# Patient Record
Sex: Female | Born: 1980 | Race: Black or African American | Hispanic: No | Marital: Single | Smoking: Never smoker
Health system: Southern US, Community
[De-identification: ages and names within clinical notes are randomized; demographics above are authoritative.]

## PROBLEM LIST (undated history)

## (undated) ENCOUNTER — Emergency Department: Admission: EM | Payer: Self-pay

## (undated) DIAGNOSIS — M67432 Ganglion, left wrist: Secondary | ICD-10-CM

## (undated) DIAGNOSIS — I10 Essential (primary) hypertension: Secondary | ICD-10-CM

## (undated) DIAGNOSIS — R7303 Prediabetes: Secondary | ICD-10-CM

## (undated) DIAGNOSIS — D219 Benign neoplasm of connective and other soft tissue, unspecified: Secondary | ICD-10-CM

## (undated) DIAGNOSIS — N979 Female infertility, unspecified: Secondary | ICD-10-CM

## (undated) DIAGNOSIS — D509 Iron deficiency anemia, unspecified: Secondary | ICD-10-CM

## (undated) DIAGNOSIS — Z8742 Personal history of other diseases of the female genital tract: Secondary | ICD-10-CM

## (undated) DIAGNOSIS — K649 Unspecified hemorrhoids: Secondary | ICD-10-CM

## (undated) DIAGNOSIS — J45909 Unspecified asthma, uncomplicated: Secondary | ICD-10-CM

## (undated) DIAGNOSIS — R590 Localized enlarged lymph nodes: Secondary | ICD-10-CM

## (undated) DIAGNOSIS — M549 Dorsalgia, unspecified: Secondary | ICD-10-CM

## (undated) HISTORY — PX: NO SURGICAL HISTORY: 101002

## (undated) HISTORY — DX: Essential (primary) hypertension: I10

## (undated) NOTE — ED Triage Notes (Signed)
Formatting of this note might be different from the original.  Pt from home c/o SOB that started last night. At approx midnight Pt was awoken from sleep with tightness in her chest and SOB. Pt stated that she was gasping for air and shallow breaths. Pt stated that the episode lasted until about 0600 this morning. Pt stated SOB has resolved, tightness remains but is listed as  a 0 out of 10 at this time.    Electronically signed by Adolm Joseph, RN at 08/04/2022  9:59 AM EST

## (undated) NOTE — ED Provider Notes (Signed)
Formatting of this note is different from the original.  Images from the original note were not included.    [facility]  Provider Note    CSN: [id number]  Arrival date & time: 08/04/22  1478        History    Chief Complaint   Patient presents with    Shortness of Breath     Kayla Morris is a 24 y.o. female.    Shortness of Breath  Patient is a 20 year old female with a past medical history significant for hypertension on BP medications, infertility, iron deficiency anemia    She presented emergency room today with chief complaint of some shortness of breath overnight.  She states she went to bed at approximately 10 PM and awoke at midnight because she states she felt somewhat short of breath.  She got up and walked around drink some water and felt better.  She fell asleep again but woke up 2 more times before 6 AM feeling somewhat short of breath each time.    She states she has had no chest pain no lightheadedness dizziness nausea or vomiting.  She states that her symptoms completely resolved by the time she was up and walking around at 6 AM this morning.  She denies any lower extremity swelling or calf pain.  She denies any cough or hemoptysis.  She is not on any oral contraceptive medications.  No recent travel or hospitalizations or Surgeries    No other significant associated symptoms.    She states that she is on a long-term monitor that was removed recently by [place] and she is awaiting results of this.      Home Medications  Prior to Admission medications    Medication Sig Start Date End Date Taking? Authorizing Provider   ferrous sulfate 220 (44 Fe) MG/5ML solution Take 10 mLs by mouth daily. 03/03/20   [provider]   NIFEdipine (PROCARDIA-XL/NIFEDICAL-XL) 30 MG 24 hr tablet 1 tablet by mount daily  Patient taking differently: 30 mg daily. 02/26/21   Chancy Milroy, MD   olmesartan-hydrochlorothiazide (BENICAR HCT) 40-12.5 MG tablet Take 1 tablet by mouth daily.  07/27/22   [provider]       Allergies     Patient has no known allergies.      Review of Systems    Review of Systems   Respiratory:  Positive for shortness of breath.      Physical Exam  Updated Vital Signs  BP (!) 159/91   Pulse (!) 52   Temp 98.3 F (36.8 C)   Resp 19   Ht '5\' 8"'$  (1.727 m)   Wt 77.1 kg   LMP 07/25/2022 (Approximate)   SpO2 100%   BMI 25.85 kg/m   Physical Exam  Vitals and nursing note reviewed.   Constitutional:       General: She is not in acute distress.     Comments: Pleasant well-appearing 31 year old.  In no acute distress.  Sitting comfortably in bed.  Able answer questions appropriately follow commands. No increased work of breathing. Speaking in full sentences.    HENT:      Head: Normocephalic and atraumatic.      Nose: Nose normal.      Mouth/Throat:      Mouth: Mucous membranes are moist.   Eyes:      General: No scleral icterus.  Cardiovascular:      Rate and Rhythm: Normal rate and regular rhythm.  Pulses: Normal pulses.      Heart sounds: Normal heart sounds.   Pulmonary:      Effort: Pulmonary effort is normal. No respiratory distress.      Breath sounds: Normal breath sounds. No wheezing.   Abdominal:      Palpations: Abdomen is soft.      Tenderness: There is no abdominal tenderness.   Musculoskeletal:      Cervical back: Normal range of motion.      Right lower leg: No edema.      Left lower leg: No edema.   Skin:     General: Skin is warm and dry.      Capillary Refill: Capillary refill takes less than 2 seconds.   Neurological:      Mental Status: She is alert. Mental status is at baseline.   Psychiatric:         Mood and Affect: Mood normal.         Behavior: Behavior normal.     ED Results / Procedures / Treatments    Labs  (all labs ordered are listed, but only abnormal results are displayed)  Labs Reviewed   BASIC METABOLIC PANEL   CBC   PREGNANCY, URINE   TROPONIN I (HIGH SENSITIVITY)     EKG  None    Radiology  DG Chest 2 View    Result Date:  08/04/2022  CLINICAL DATA:  Chest tightness.  Shortness of breath. EXAM: CHEST - 2 VIEW COMPARISON:  03/07/2022 FINDINGS: The heart size and mediastinal contours are within normal limits. Both lungs are clear. The visualized skeletal structures are unremarkable. IMPRESSION: No active cardiopulmonary disease. Electronically Signed   By: Kerby Moors M.D.   On: 08/04/2022 10:46      Procedures  Procedures     Medications Ordered in ED  Medications   alum & mag hydroxide-simeth (MAALOX/MYLANTA) 200-200-20 MG/5ML suspension 30 mL (30 mLs Oral Given 08/04/22 1310)   famotidine (PEPCID) tablet 20 mg (20 mg Oral Given 08/04/22 1309)     ED Course/ Medical Decision Making/ A&P      Medical Decision Making  Amount and/or Complexity of Data Reviewed  Labs: ordered.  Radiology: ordered.    Risk  OTC drugs.    This patient presents to the ED for concern of dyspnea overnight, this involves a number of treatment options, and is a complaint that carries with it a moderate to high risk of complications and morbidity. A differential diagnosis was considered for the patient's symptoms which is discussed below:     The causes for shortness of breath include but are not limited to  Cardiac (AHF, pericardial effusion and tamponade, arrhythmias, ischemia, etc)  Respiratory (COPD, asthma, pneumonia, pneumothorax, primary pulmonary hypertension, PE/VQ mismatch)  Hematological (anemia)  Neuromuscular (ALS, Guillain-Barr, etc)     Co morbidities:  Discussed in HPI    Brief History:    Patient is a 65 year old female with a past medical history significant for hypertension on BP medications, infertility, iron deficiency anemia    She presented emergency room today with chief complaint of some shortness of breath overnight.  She states she went to bed at approximately 10 PM and awoke at midnight because she states she felt somewhat short of breath.  She got up and walked around drink some water and felt better.  She fell asleep again but woke  up 2 more times before 6 AM feeling somewhat short of breath each time.    She states  she has had no chest pain no lightheadedness dizziness nausea or vomiting.  She states that her symptoms completely resolved by the time she was up and walking around at 6 AM this morning.  She denies any lower extremity swelling or calf pain.  She denies any cough or hemoptysis.  She is not on any oral contraceptive medications.  No recent travel or hospitalizations or Surgeries    No other significant associated symptoms.    She states that she is on a long-term monitor that was removed recently by [place] and she is awaiting results of this.    EMR reviewed including pt PMHx, past surgical history and past visits to ER.     See HPI for more details    Lab Tests:    I personally reviewed all laboratory work and imaging. Metabolic panel without any acute abnormality specifically kidney function within normal limits and no significant electrolyte abnormalities. CBC without leukocytosis or significant anemia.  Troponin which was obtained at approximately 9 AM approximately 3 hours after his symptoms resolved and 9 hours after her symptoms began was within normal limits.    Imaging Studies:    NAD. I personally reviewed all imaging studies and no acute abnormality found. I agree with radiology interpretation.    Cardiac Monitoring:    The patient was maintained on a cardiac monitor.  I personally viewed and interpreted the cardiac monitored which showed an underlying rhythm of: NSR  EKG non-ischemic    Medicines ordered:    I ordered medication including GI cocktail and Pepcid for possible reflux related symptoms  Reevaluation of the patient after these medicines showed that the patient stayed the same  I have reviewed the patients home medicines and have made adjustments as needed    Critical Interventions:    Consults/Attending Physician    Reevaluation:    After the interventions noted above I re-evaluated patient and found that they  have :stayed the same    Social Determinants of Health:    Problem List / ED Course:    Dyspnea overnight.  She woke up 3 times in the middle of the night feeling somewhat short of breath but was able to fall asleep afterwards each time.  Is symptom-free now.  Troponin was drawn 3 hours after her symptoms resolved is normal.  She had persistent symptoms for approximately 6 hours between midnight and 6 AM.  Work-up here reassuring.  She has been seen for some tachycardic episodes in the past and had a negative PET scan done 5 months ago.  I have very low suspicion for pulmonary embolism and patient is in fact PERC negative.  She is symptom-free currently.  No evidence of pneumothorax on chest x-ray, doubt dissection with complete resolution of her symptoms and no chest pain or paresthesias numbness or weakness, and symmetric pulses.  Low suspicion for pericarditis or myocarditis the latter of which would have elevated her troponins and the former is not supported by her history, physical exam, EKG.  Return precautions discussed.  She will follow-up with her care team at the [place].  She will return to the emergency room for any return of symptoms or any new or concerning symptoms.  As she is completely symptom-free at this time.  Will discharge home with return precautions.    Dispostion:    After consideration of the diagnostic results and the patients response to treatment, I feel that the patent would benefit from close outpatient follow-up.    Final Clinical  Impression(s) / ED Diagnoses  Final diagnoses:   Dyspnea, unspecified type     Rx / DC Orders  ED Discharge Orders       None           Tedd Sias, [place]  08/04/22 1328      Regan Lemming, MD  08/04/22 1723    Electronically signed by Regan Lemming, MD at 08/04/2022  5:23 PM EST    Associated attestation - Regan Lemming, MD - 08/04/2022  5:23 PM EST  Formatting of this note might be different from the original.  Attestation: Medical screening  examination/treatment/procedure(s) were performed by non-physician practitioner and as supervising physician I was immediately available for consultation/collaboration.

## (undated) NOTE — ED Notes (Signed)
Formatting of this note might be different from the original.  Discharge instructions and follow up care reviewed and explained, pt verbalized understanding and had no further questions on d/c. Pt caox4 and ambulatory on d/c.   Electronically signed by Gaspar Skeeters, RN at 08/04/2022  2:03 PM EST

## (undated) NOTE — ED Notes (Signed)
Formatting of this note might be different from the original.  Pt given urine cup to provide a sample.  Electronically signed by Belinda Fisher, EMT at 08/04/2022 10:47 AM EST

---

## 1998-06-11 ENCOUNTER — Other Ambulatory Visit: Admission: RE | Admit: 1998-06-11 | Discharge: 1998-06-11 | Payer: Self-pay | Admitting: Obstetrics and Gynecology

## 1999-07-07 ENCOUNTER — Other Ambulatory Visit: Admission: RE | Admit: 1999-07-07 | Discharge: 1999-07-07 | Payer: Self-pay | Admitting: Obstetrics and Gynecology

## 2002-02-06 ENCOUNTER — Other Ambulatory Visit: Admission: RE | Admit: 2002-02-06 | Discharge: 2002-02-06 | Payer: Self-pay | Admitting: Obstetrics and Gynecology

## 2004-10-16 ENCOUNTER — Other Ambulatory Visit: Admission: RE | Admit: 2004-10-16 | Discharge: 2004-10-16 | Payer: Self-pay | Admitting: Obstetrics and Gynecology

## 2005-03-27 ENCOUNTER — Emergency Department (HOSPITAL_COMMUNITY): Admission: EM | Admit: 2005-03-27 | Discharge: 2005-03-27 | Payer: Self-pay | Admitting: Emergency Medicine

## 2005-04-15 ENCOUNTER — Other Ambulatory Visit: Admission: RE | Admit: 2005-04-15 | Discharge: 2005-04-15 | Payer: Self-pay | Admitting: Obstetrics and Gynecology

## 2005-06-11 ENCOUNTER — Emergency Department (HOSPITAL_COMMUNITY): Admission: EM | Admit: 2005-06-11 | Discharge: 2005-06-11 | Payer: Self-pay | Admitting: Family Medicine

## 2005-07-01 ENCOUNTER — Other Ambulatory Visit: Admission: RE | Admit: 2005-07-01 | Discharge: 2005-07-01 | Payer: Self-pay | Admitting: Obstetrics and Gynecology

## 2005-10-15 ENCOUNTER — Other Ambulatory Visit: Admission: RE | Admit: 2005-10-15 | Discharge: 2005-10-15 | Payer: Self-pay | Admitting: Obstetrics and Gynecology

## 2006-06-17 ENCOUNTER — Emergency Department (HOSPITAL_COMMUNITY): Admission: EM | Admit: 2006-06-17 | Discharge: 2006-06-17 | Payer: Self-pay | Admitting: Family Medicine

## 2006-10-27 ENCOUNTER — Emergency Department (HOSPITAL_COMMUNITY): Admission: EM | Admit: 2006-10-27 | Discharge: 2006-10-27 | Payer: Self-pay | Admitting: Family Medicine

## 2007-09-28 ENCOUNTER — Emergency Department (HOSPITAL_COMMUNITY): Admission: EM | Admit: 2007-09-28 | Discharge: 2007-09-28 | Payer: Self-pay | Admitting: Family Medicine

## 2017-11-23 ENCOUNTER — Encounter: Payer: Self-pay | Admitting: Family Medicine

## 2017-11-23 ENCOUNTER — Telehealth: Payer: Self-pay

## 2017-11-23 NOTE — Progress Notes (Signed)
PVP chart review for new patient. Attempted to get CareEverywhere Records - none found. Patient's appointment is on 02/27

## 2017-11-24 ENCOUNTER — Ambulatory Visit: Payer: 59 | Admitting: Family Medicine

## 2017-11-24 ENCOUNTER — Encounter: Payer: Self-pay | Admitting: Family Medicine

## 2017-11-24 VITALS — BP 124/90 | HR 67 | Temp 97.3°F | Ht 68.0 in | Wt 187.4 lb

## 2017-11-24 DIAGNOSIS — N760 Acute vaginitis: Principal | ICD-10-CM

## 2017-11-24 DIAGNOSIS — I10 Essential (primary) hypertension: Secondary | ICD-10-CM

## 2017-11-24 MED ORDER — FLUCONAZOLE 150 MG TABLET
150.0000 mg | ORAL_TABLET | Freq: Once | ORAL | 0 refills | Status: AC
Start: 2017-11-24 — End: 2017-11-25

## 2017-11-24 NOTE — Patient Instructions (Addendum)
Start checking blood pressure at home once daily, write the numbers down and e-mail (MyChart), mail or drop off the list of readings to me after 7-10 days of monitoring your BP at home.        High Blood Pressure: Care Instructions  Your Care Instructions  If your blood pressure is usually above 140/90, you have high blood pressure, or hypertension. Despite what a lot of people think, high blood pressure usually doesn't cause headaches or make you feel dizzy or lightheaded. It usually has no symptoms. But it does increase your risk for heart attack, stroke, and kidney or eye damage. The higher your blood pressure, the more your risk increases.  Your doctor will give you a goal for your blood pressure. Your goal will be based on your health and your age. An example of a goal is to keep your blood pressure below 140/90.  Lifestyle changes, such as eating healthy and being active, are always important to help lower blood pressure. You might also take medicine to reach your blood pressure goal.  Follow-up care is a key part of your treatment and safety. Be sure to make and go to all appointments, and call your doctor if you are having problems. It's also a good idea to know your test results and keep a list of the medicines you take.  How can you care for yourself at home?  Medical treatment   If you stop taking your medicine, your blood pressure will go back up. You may take one or more types of medicine to lower your blood pressure. Be safe with medicines. Take your medicine exactly as prescribed. Call your doctor if you think you are having a problem with your medicine.   Your doctor may suggest that you take one low-dose aspirin (81 mg) a day. This can help reduce your risk of having a stroke or heart attack.   See your doctor regularly. You may need to see the doctor more often at first or until your blood pressure comes down.   If you are taking blood pressure medicine, talk to your doctor before you take  decongestants or anti-inflammatory medicine, such as ibuprofen. Some of these medicines can raise blood pressure.   Learn how to check your blood pressure at home.  Lifestyle changes   Stay at a healthy weight. This is especially important if you put on weight around the waist. Losing even 10 pounds can help you lower your blood pressure.   If your doctor recommends it, get more exercise. Walking is a good choice. Bit by bit, increase the amount you walk every day. Try for at least 30 minutes on most days of the week. You also may want to swim, bike, or do other activities.   Avoid or limit alcohol. Talk to your doctor about whether you can drink any alcohol.   Try to limit how much sodium you eat to less than 2,300 milligrams (mg) a day. Your doctor may ask you to try to eat less than 1,500 mg a day.   Eat plenty of fruits (such as bananas and oranges), vegetables, legumes, whole grains, and low-fat dairy products.   Lower the amount of saturated fat in your diet. Saturated fat is found in animal products such as milk, cheese, and meat. Limiting these foods may help you lose weight and also lower your risk for heart disease.   Do not smoke. Smoking increases your risk for heart attack and stroke. If you need help  quitting, talk to your doctor about stop-smoking programs and medicines. These can increase your chances of quitting for good.  When should you call for help?  Call your doctor now or seek immediate medical care if:   Your blood pressure is much higher than normal (such as 180/110 or higher).   You think high blood pressure is causing symptoms such as:   Severe headache.   Blurry vision.  Watch closely for changes in your health, and be sure to contact your doctor if:   You do not get better as expected.   Where can you learn more?   Go to http://[facility].com/  Enter [phone] in the search box to learn more about "High Blood Pressure: Care Instructions."    2006-2015 Healthwise,  Incorporated. Care instructions adapted under license by [facility]. This care instruction is for use with your licensed healthcare professional. If you have questions about a medical condition or this instruction, always ask your healthcare professional. [place] any warranty or liability for your use of this information.  Content Version: 10.6.465758; Current as of: November 17, 2013

## 2017-11-24 NOTE — Progress Notes (Addendum)
Chief Complaint   Patient presents with    Establish Care     Pt reports high BP    Other     Pt reports yeast infection          History of Present Illness:  Kayla Morris is a 37yr old female  here for chief complaint noted above.    Establish care - past medical history, past surgical history, social and family history, medications all reviewed and documented. Any of the above not documented were submitted to pre-visit planner for completion of documentation.      Elevated blood pressure. Still taking amlodipien, has refills for now.   Trying to increase exercise and decrease salt intake, eat healthier.   Denies chest pain, blurry vision, headache.      Vaginitis - itchy with thick white d/c.         ROS:  A full review of systems including: General, Respiratory, Cardiovascular, Gastrointestinal, Genitouinary, Hematologic, Musculoskeletal, Skin, Immunologic, Psychiatric, Neurologic has been reviewed and is negative except for the positives listed in HPI.     Past Medical History:   Diagnosis Date    Hypertension         Family History   Problem Relation Name Age of Onset    Hypertension Mother      Hypertension Sister          Social History     Tobacco Use    Smoking status: Never Smoker    Smokeless tobacco: Never Used   Substance Use Topics    Alcohol use: Yes     Frequency: 2-4 times a month     Drinks per session: 1 or 2     Binge frequency: Never        Current Outpatient Medications   Medication Sig    Fluconazole (DIFLUCAN) 150 mg Tablet Take 1 tablet by mouth one time for 1 day.     No current facility-administered medications for this visit.         Allergies as of 11/24/2017    (No Known Allergies)        OBJECTIVE:   BP 124/90 (SITE: right arm, Orthostatic Position: sitting, Cuff Size: large)   Pulse 67   Temp 36.3 C (97.3 F) (Tympanic)   Ht 1.727 m (5\' 8" )   Wt 85 kg (187 lb 6.3 oz)   LMP 10/19/2017 (Approximate)   SpO2 100%   BMI 28.49 kg/m    Physical Exam:General Appearance:  healthy, alert, no distress, pleasant affect, cooperative.  Eyes:  conjunctivae and corneas clear.   Nose:  No drainage.  Mouth: MMM.  Neck:  Neck supple. Marland Kitchen  Heart:  normal rate and regular rhythm, no murmurs, clicks, or gallops.  Lungs: clear to auscultation, no wheezes, rales, rhonchi.  Abdomen: Abdomen soft, non-tender.  No masses or organomegaly.  Extremities:  no edema.               ASSESSMENT and PLAN:  (N76.0) Acute vaginitis  (primary encounter diagnosis)  Comment:   -presumed yeast.   Plan: Fluconazole (DIFLUCAN) 150 mg Tablet    (I10) Essential hypertension  Comment:   -patient prefers to continue to work on lifestyle modifications rather than increase medication at this time.   - follow up in 3 mo to see if lifestyle modifications result in lower blood pressure, consider medication adjustment at that time if needed.   Plan: follow up 3 mo  I did review patient's past medical and family/social history, no changes noted.  Barriers to Learning assessed: none. Patient verbalizes understanding of teaching and instructions.  Education Needs Identified?   no    Health Self-Management goal reviewed today. Yes, Patient was referred for health self-management support to : n/a    Patient was counseled specifically on the following: diagnostic results, impressions, and/or recommended diagnostic studies reviewed, instructions for management and/or follow-up, risk factor reduction and patient and family education    I spent 25 minutes with the patient, >50% of which were spent counseling her regarding differential, additional testing, if indicated, potential treatment options, and lifestyle modifications, if applicable    Parts of this note may have been created with the support of Dragon dictation. Please accept any grammatical, sound-alike, or syntax errors as likely dictation errors.        Electronically signed by:  Marton Redwood, M.D.  Associate Physician  [facility]  Pgr: [phone]

## 2017-11-24 NOTE — Nursing Note (Signed)
Vital signs taken, allergies verified, screened for pain, tobacco hx verified.    Raegyn Renda, MA II     I have asked the patient if they have received any medical treatment or consultation outside of the [facility].  The patient has indicated (yes or no) to receiving consults or services outside the  Health system.  If yes, we have requested information to ascertain these results.

## 2018-01-13 ENCOUNTER — Encounter: Payer: Self-pay | Admitting: Family Medicine

## 2018-01-13 NOTE — Telephone Encounter (Signed)
From: [name]  To: Marton Redwood, MD  Sent: 01/13/2018 7:22 AM PDT  Subject: Non-urgent Medical Advice Question    I'm having concerns about digestive issues. Sometimes when I have a bowl movement I experience itching and discomfort. Please advise.

## 2018-02-23 ENCOUNTER — Encounter: Payer: Self-pay | Admitting: Family Medicine

## 2018-02-23 ENCOUNTER — Ambulatory Visit: Payer: 59 | Admitting: Family Medicine

## 2018-02-23 VITALS — BP 135/86 | HR 73 | Temp 96.9°F | Ht 68.0 in | Wt 191.6 lb

## 2018-02-23 DIAGNOSIS — R221 Localized swelling, mass and lump, neck: Principal | ICD-10-CM

## 2018-02-23 DIAGNOSIS — I10 Essential (primary) hypertension: Secondary | ICD-10-CM

## 2018-02-23 DIAGNOSIS — N760 Acute vaginitis: Secondary | ICD-10-CM

## 2018-02-23 DIAGNOSIS — R220 Localized swelling, mass and lump, head: Principal | ICD-10-CM

## 2018-02-23 NOTE — Progress Notes (Signed)
Chief Complaint   Patient presents with    Other     3 mo f/u on BP and yeast infection     Lump     Pt reports lump under left chin x 2 days         History of Present Illness:  Kayla Morris is a 37yr old female  here for chief complaint noted above.    blood pressure   No changes.   Working on lifestyle modifications still, but not working out as much as she planned.     Vaginitis, resolved.     Swelling under left jaw. Minimally tender.  Popped up in the last 3-4 days.   No fever. Denies dry mouth.   Mild rash behind ears, thinks from earrings, but denies recent illness, other skin issues.     ROS:  A full review of systems including: General, Respiratory, Cardiovascular, Gastrointestinal, Genitouinary, Hematologic, Musculoskeletal, Skin, Immunologic, Psychiatric, Neurologic has been reviewed and is negative except for the positives listed in HPI.     Past Medical History:   Diagnosis Date    Hypertension         Family History   Problem Relation Name Age of Onset    Hypertension Mother      Hypertension Sister          Social History     Tobacco Use    Smoking status: Never Smoker    Smokeless tobacco: Never Used   Substance Use Topics    Alcohol use: Yes     Frequency: 2-4 times a month     Drinks per session: 1 or 2     Binge frequency: Never        No current outpatient medications on file.     No current facility-administered medications for this visit.         Allergies as of 02/23/2018    (No Known Allergies)        OBJECTIVE:   BP 135/86 (SITE: left arm, Orthostatic Position: sitting, Cuff Size: regular)   Pulse 73   Temp 36.1 C (96.9 F) (Tympanic)   Ht 1.727 m (5\' 8" )   Wt 86.9 kg (191 lb 9.3 oz)   SpO2 99%   BMI 29.13 kg/m    Physical Exam:General Appearance: healthy, alert, no distress, pleasant affect, cooperative.  Eyes:  conjunctivae and corneas clear.   Nose:  No drainage.  Mouth: MMM.  Neck:  Neck supple. Left submandibular swelling, ~1cm. Minimally ttp, not warm to touch.      PHQ-2 11/24/2017   Interest 0   Feeling 0   PHQ-2 Total Score 0        ASSESSMENT and PLAN:  (R22.0,  R22.1) Submandibular swelling  (primary encounter diagnosis)  Comment:   - likley blocked salivary duct, r/o other poss causes of signficant enlargement.   -see patient instructions.   Plan: US SOFT TISSUE HEAD / NECK / SCALP         (NON-THYROID)            (I10) Essential hypertension  Comment:   Stable/at goal.  Continue current management.    Plan:     (N76.0) Acute vaginitis  Comment: resolved  Plan: follow up prn       I did review patient's past medical and family/social history, no changes noted.  Barriers to Learning assessed: none. Patient verbalizes understanding of teaching and instructions.  Education Needs Identified?   no    Health  Self-Management goal reviewed today. Yes, Patient was referred for health self-management support to : n/a    Patient was counseled specifically on the following: diagnostic results, impressions, and/or recommended diagnostic studies reviewed, instructions for management and/or follow-up, risk factor reduction and patient and family education    I spent 25 minutes with the patient, >50% of which were spent counseling her regarding differential, additional testing, if indicated, potential treatment options, and lifestyle modifications, if applicable    Parts of this note may have been created with the support of Dragon dictation. Please accept any grammatical, sound-alike, or syntax errors as likely dictation errors.        Electronically signed by:  Marton Redwood, M.D.  Associate Physician  [facility]  Pgr: [phone]

## 2018-02-23 NOTE — Patient Instructions (Signed)
Salivary Gland Stone: Care Instructions  Your Care Instructions    Salivary glands make saliva, or spit. A salivary gland stone is a piece of hard material, usually calcium, that can form in any of the three main salivary glands in the mouth. Salivary gland stones are also called salivary duct stones. Stones form most often in the gland that releases saliva below the tongue.  A stone can block saliva from flowing out of the gland. When saliva backs up behind the stone, it can make the gland swell. The gland swells while you are eating, and then the swelling goes down slowly afterward. The swelling and pain may be under the jaw or in the area in front of the ear, depending on which gland is affected.  Your doctor will ask you about your symptoms. He or she may be able to see and feel the gland under your skin or in the floor of your mouth. You may get an imaging test, such as a CT scan or ultrasound. This will help your doctor know if you have a stone and not some other problem.  Most stones come out into the mouth on their own. While the stone is in the gland, your doctor may have you take medicine for pain. There are also some things you can do at home to help move the stone. If the stone in your gland hasn't come out within a few weeks, your doctor will discuss treatment options with you.  Follow-up care is a key part of your treatment and safety. Be sure to make and go to all appointments, and call your doctor if you are having problems. It's also a good idea to know your test results and keep a list of the medicines you take.  How can you care for yourself at home?   Be safe with medicines. Read and follow all instructions on the label.   If the doctor gave you a prescription medicine for pain, take it as prescribed.   If you are not taking a prescription pain medicine, ask your doctor if you can take an over-the-counter medicine.   If your doctor prescribed antibiotics, take them as directed. Do not  stop taking them just because you feel better. You need to take the full course of antibiotics.   Use sugar-free gum or candies such as lemon drops, or suck on a lemon wedge. They increase saliva, which may help push the stone out.   Gently massage the affected gland to help move the stone.  When should you call for help?  Call your doctor now or seek immediate medical care if:  ?  You have symptoms of infection, such as:   Increased pain, swelling, warmth, or redness.   Red streaks leading from the salivary gland.   Pus draining from the area where the saliva comes out into the mouth.   A fever.   ?Watch closely for changes in your health, and be sure to contact your doctor if:  ?  You do not get better as expected.   Where can you learn more?  Go to https://www.healthwise.net/patientEd.  Enter E983 in the search box to learn more about "Salivary Gland Stone: Care Instructions."  Current as of: Feb 07, 2016  Content Version: 11.4   2006-2017 [employer]. Care instructions adapted under license by your healthcare professional. If you have questions about a medical condition or this instruction, always ask your healthcare professional. [employer] Inc[employer] disclaims any warranty or liability for your   use of this information.

## 2018-02-23 NOTE — Nursing Note (Signed)
Vital signs taken, allergies verified, screened for pain, tobacco hx verified.    Adrena Nakamura, MA II     I have asked the patient if they have received any medical treatment or consultation outside of the [facility].  The patient has indicated (yes or no) to receiving consults or services outside the [facility].  If yes, we have requested information to ascertain these results.

## 2018-02-28 ENCOUNTER — Ambulatory Visit
Admission: RE | Admit: 2018-02-28 | Discharge: 2018-02-28 | Disposition: A | Discharge status: Home / Self Care | Payer: 59 | Source: Ambulatory Visit | Attending: DIAGNOSTIC RADIOLOGY | Admitting: DIAGNOSTIC RADIOLOGY

## 2018-02-28 DIAGNOSIS — R221 Localized swelling, mass and lump, neck: Secondary | ICD-10-CM | POA: Insufficient documentation

## 2018-02-28 DIAGNOSIS — R220 Localized swelling, mass and lump, head: Principal | ICD-10-CM | POA: Insufficient documentation

## 2018-03-01 ENCOUNTER — Ambulatory Visit: Payer: 59 | Admitting: Family Medicine

## 2018-03-01 ENCOUNTER — Ambulatory Visit: Payer: 59 | Attending: Family Medicine

## 2018-03-01 ENCOUNTER — Telehealth: Payer: Self-pay | Admitting: Family Medicine

## 2018-03-01 ENCOUNTER — Encounter: Payer: Self-pay | Admitting: Family Medicine

## 2018-03-01 ENCOUNTER — Ambulatory Visit (INDEPENDENT_AMBULATORY_CARE_PROVIDER_SITE_OTHER): Payer: 59 | Admitting: Family Medicine

## 2018-03-01 VITALS — BP 138/89 | HR 58 | Temp 97.6°F | Ht 68.0 in | Wt 190.7 lb

## 2018-03-01 DIAGNOSIS — R591 Generalized enlarged lymph nodes: Secondary | ICD-10-CM

## 2018-03-01 LAB — CBC WITH DIFFERENTIAL
BASOPHILS % AUTO: 1.1 %
BASOPHILS ABS AUTO: 0.1 10*3/uL (ref 0.0–0.2)
EOSINOPHIL % AUTO: 2.6 %
EOSINOPHIL ABS AUTO: 0.2 10*3/uL (ref 0.0–0.5)
HEMATOCRIT: 37.7 % (ref 34.0–46.0)
HEMOGLOBIN: 12.6 g/dL (ref 12.0–16.0)
LYMPHOCYTE ABS AUTO: 1.6 10*3/uL (ref 1.0–4.8)
LYMPHOCYTES % AUTO: 24.4 %
MCH: 27.3 pg (ref 27.0–33.0)
MCHC: 33.5 % (ref 32.0–36.0)
MCV: 81.6 fL (ref 80.0–100.0)
MONOCYTES % AUTO: 7.9 %
MPV: 8.3 fL (ref 6.8–10.0)
Monocytes Abs Auto: 0.5 10*3/uL (ref 0.1–0.8)
NEUTROPHIL ABS AUTO: 4.2 10*3/uL (ref 1.8–7.7)
NEUTROPHILS % AUTO: 64 %
PLATELET COUNT: 334 10*3/uL (ref 130–400)
RDW: 15.8 % — AB (ref 0.0–14.7)
RED CELL COUNT: 4.62 10*6/uL (ref 3.70–5.50)
WHITE BLOOD CELL COUNT: 6.6 10*3/uL (ref 4.5–11.0)

## 2018-03-01 LAB — COMPREHENSIVE METABOLIC PANEL
ALANINE TRANSFERASE (ALT): 20 U/L (ref 5–54)
ALBUMIN: 3.9 g/dL (ref 3.4–4.8)
ALKALINE PHOSPHATASE (ALP): 74 U/L (ref 35–115)
ASPARTATE TRANSAMINASE (AST): 17 U/L (ref 15–43)
BILIRUBIN TOTAL: 0.6 mg/dL (ref 0.3–1.3)
CALCIUM: 9.5 mg/dL (ref 8.6–10.5)
CARBON DIOXIDE TOTAL: 26 mmol/L (ref 24–32)
CREATININE BLOOD: 0.99 mg/dL (ref 0.44–1.27)
Chloride: 103 mmol/L (ref 95–110)
Glucose: 84 mg/dL (ref 70–99)
POTASSIUM: 4.2 mmol/L (ref 3.3–5.0)
Protein: 7.7 g/dL (ref 6.3–8.3)
Sodium: 138 mmol/L (ref 135–145)
UREA NITROGEN, BLOOD (BUN): 13 mg/dL (ref 8–22)

## 2018-03-01 LAB — SED RATE WESTERGREN: SED RATE WESTERGREN: 14 mm/h (ref 0–20)

## 2018-03-01 LAB — TSH WITH FREE T4 REFLEX: THYROID STIMULATING HORMONE: 1.83 u[IU]/mL (ref 0.35–3.30)

## 2018-03-01 MED ORDER — AZITHROMYCIN 250 MG TABLET
ORAL_TABLET | ORAL | 0 refills | Status: AC
Start: 2018-03-01 — End: 2018-03-05

## 2018-03-01 NOTE — Nursing Note (Signed)
Vital signs taken, allergies verified, screened for pain, tobacco hx verified.  Ngoc Detjen Carlos- Castillo, MA I     I have asked the patient if they have received any medical treatment or consultation outside of the [facility].  The patient has indicated (yes or no) to receiving consults or services outside the [facility].  If yes, we have requested information to ascertain these results.

## 2018-03-01 NOTE — Telephone Encounter (Signed)
3 patient identifiers used    Appointment booked for patient to come in to discuss results    Dhruva Orndoff, RN

## 2018-03-01 NOTE — Progress Notes (Signed)
Patient presents with:  Test Results      History:  Kayla Morris is a 37yr old female who presents to clinic for a lump in her submandibular area for a week. She reports no fever. It is mildly tender. No redness noted. She states it is better. Ultrasound results show lymphadenopathy.     Past Medical History:  No date: Hypertension    Review of patient's family history indicates:  Problem: Hypertension     Relation: Mother         Name:              Age of Onset: (Not Specified)  Problem: Hypertension     Relation: Sister         Name:              Age of Onset: (Not Specified)      Social History   Tobacco Use     Smoking status: Never Smoker     Smokeless tobacco: Never Used   Alcohol use: Yes     Frequency: 2-4 times a month     Drinks per session: 1 or 2     Binge frequency: Never   Drug use: Never      Constitutional: negative.  Ears, Nose, Mouth, Throat: neck swelling.   CV: negative.  Resp: negative.  Musculoskeletal: negative.    Physical Exam:    BP 138/89 (SITE: right arm, Orthostatic Position: sitting, Cuff Size: regular)   Pulse 58   Temp 36.4 C (97.6 F) (Tympanic)   Ht 1.727 m (5\' 8" )   Wt 86.5 kg (190 lb 11.2 oz)   SpO2 100%   BMI 29.00 kg/m   Physical Exam:  General Appearance: healthy, alert, no distress, pleasant affect, cooperative.  Ears:  normal TMs and canal and external inspection of ears show no abnormality.  Nose:  normal.  Mouth: normal.  Neck:   lymphadenopathy submandibular left.   Heart:  normal rate and regular rhythm, no murmurs, clicks, or gallops.  Lungs: clear to auscultation.  Musculoskeletal: Good range of motion x 4.        1. Lymphadenopathy of head and neck    -follow up with pcp in 2 weeks.   -Ultrasound reviewed.   -Will try azithromycin and see if it helps, possible infectious etiology.   - CBC WITH DIFFERENTIAL; Future  - COMPREHENSIVE METABOLIC PANEL; Future  - TSH WITH FREE T4 REFLEX; Future  - SED RATE WESTERGREN; Future  - ENT CLINIC REFERRAL    Family history  social history medications and allergies reviewed today.  There are no barriers to medical care.  This dictation was performed with the aid of Dragon NaturallySpeaking.  Although proof checked there may be typed errors.    Clovia Cuff, MD

## 2018-03-21 ENCOUNTER — Encounter: Payer: Self-pay | Admitting: Family Medicine

## 2018-03-21 ENCOUNTER — Ambulatory Visit: Payer: 59 | Admitting: Family Medicine

## 2018-03-21 VITALS — BP 134/80 | HR 80 | Temp 97.2°F | Ht 68.0 in | Wt 190.9 lb

## 2018-03-21 DIAGNOSIS — R591 Generalized enlarged lymph nodes: Secondary | ICD-10-CM

## 2018-03-21 DIAGNOSIS — R21 Rash and other nonspecific skin eruption: Principal | ICD-10-CM

## 2018-03-21 MED ORDER — TRIAMCINOLONE ACETONIDE 0.1 % TOPICAL CREAM
TOPICAL_CREAM | Freq: Two times a day (BID) | TOPICAL | 1 refills | Status: AC
Start: 2018-03-21 — End: 2018-09-17

## 2018-03-21 NOTE — Progress Notes (Signed)
PATIENT: Kayla Morris [facility]   MR #: [id number] SEX: female  AGE: 22yr  DATE OF SERVICE: 03/21/2018 DOB: 03-Jul-1981    OTOLARYNGOLOGY CLINIC NOTE      CHIEF COMPLAINT:  left neck swelling     Dear Dr. Rosana Hoes:    I had the pleasure of seeing Kayla Morris  in consultation on your extremely kind recommendation.  My full evaluation is below.    As you're aware, Ms. Kayla Morris is a very kind female with chief complaint of left submandibular area swelling. It has now completely resolved. She has had a total of 3 episodes over the past two years with the most recent occurring about one month ago. The most recent episode was the most severe and she experienced significant tenderness to the area. She denies any corresponding symptoms. There were no alleviating or worsening measures. She She denies decreased or increased saliva production, dry eyes, foul taste or purulent drainage in her mouth, or dry eyes. She has no muscle weakness.  She has not experienced right sided swelling. She denies smoking or drinking history.     She denies weight loss, night sweats, or unintentional weight loss.     PAST MEDICAL HISTORY:    Past Medical History:   Diagnosis Date    Hypertension        PAST SURGICAL HISTORY:    Past Surgical History:   Procedure Laterality Date    NO SURGICAL HISTORY         ALLERGIES:    Patient has no known allergies.    MEDICATIONS:    Current Outpatient Medications   Medication Sig    Triamcinolone (KENALOG) 0.1 % Cream Apply to the affected area 2 times daily. (rash)     No current facility-administered medications for this visit.        SOCIAL HISTORY:    Social History     Tobacco Use    Smoking status: Never Smoker    Smokeless tobacco: Never Used   Substance Use Topics    Alcohol use: Yes     Frequency: 2-4 times a month     Drinks per session: 1 or 2     Binge frequency: Never    Drug use: Never       REVIEW OF SYMPTOMS:    General:  No fever, chills, night sweats, weight gain or  weight loss.    HEENT:  No recent itchy / watery eyes.  No current otalgia or ear drainage.  No sore throat.  No epistaxis or anosmia.   Integumentary:  No new skin lesions or bleeding.  No skin blistering.   Neurologic:  No recent aphasia or dysarthria.  No new extremity weakness.  No new double vision.  Cardiovascular: No hypertension.  No CVA, deep vein thrombosis, or myocardial infarction.  No orthopnea, paroxysmal nocturnal dyspnea, or chest pain.    Pulmonary: No history of asthma, pneumonia, history of bronchitis.  No chronic cough or hemoptysis.    Gastrointestinal: No nausea and vomiting, constipation or diarrhea.  No blood in stools, black stools, change in stools, or abdominal pain.    Genitourinary:  No polynephritis or nephrolithiasis.  No burning urination or discolored urination.  Endocrine: No heat intolerance, cold intolerance, polydipsia, polyphagia, or polyuria.    Musculoskeletal: No muscle, bone, or joint problems.  No new muscle weakness.    Hematologic:  No easy bruising or bleeding.  Psychiatric:  No current suicidal or homicidal ideation.  No depressed mood.  PHYSICAL EXAMINATION:    General: The patient is well-developed, well-nourished and in no acute distress.     Skin: The skin is warm and dry.    Head: Normocephalic, atraumatic.     Eyes: Pupils equally round and reactive to light, extraocular movements are intact, conjunctiva and sclera are clear.     Ears: External auditory canals are clear bilaterally. Tympanic membranes are intact and without evidence of middle ear effusion or infection.     Nasal exam: Nares are patent and without discharge, anterior septum is without significant deviation and nasal mucosa is normal without lesions ormasses.    Oral cavity / oropharynx: Mucosa normal and without evidence of lesions or ulcerations. Tongue normal and without masses. Floor of mouth and retromolar trigone without evidence of lesions. Oropharynx exam is without mucosal lesion or  induration.     Larynx and hypopharynx:  negative by mirror examination. Vocal folds mobile, cannot visualize anterior half.     Neck: Supple and without adenopathy.     Respiratory: The patient is breathing comfortably in no acute distress.     Neurologic exam: Cranial nerves II-XII are grossly intact.     Procedure: In-office ultrasound  Mildly enlarged left periglandular nodes with benign features. No masses or stones.       RESULTS     Ultrasound 02/28/2018:  IMPRESSION:  1. Submandibular lymphadenopathy, which could be reactive or malignant.    Correlate for signs of infection or malignancy in the mouth and pharynx.  Consider fine needle aspiration and biopsy of the lymph nodes if there is  suspicion of cancer or if they do not regress with conservative treatment.      IMPRESSION/PLAN:      Kayla Morris is a 37yr old female:      -- Left submandibular lymphadenopathy  The patient has had 3 episodes of left submandibular gland adenopathy over a three year period. Physical exam was unremarkable today and in-office ultrasound showed slightly enlarged periglandular benign appearing lymph nodes but was otherwise normal. We discussed that swelling may be caused by an inflammatory process such as sialoadenitis (no calculi visualized today) from submandibular duct scarring or other cause of salivary stasis/obstruction. If this becomes a chronic or recurring problem, we recommend further work-up for autoimmune, viral  Cause, or other systemic process. Additionally, we could offer her sialoendoscopy to improve inflammatory. She was instructed to return immediately to clinic if there is an increase in size, new neck masses/episodes, or any axillary lymphadenopathy      -- Return to clinic if symptoms recur. Patient was instructed to contact us immediately for any new or worsening symptoms.  She expresses understanding.      Barriers to Learning assessed: None. Patient verbalizes understanding of teaching and  instructions.    Thank you very much for allowing our team to participate in the medical care of this pleasant patient. If there is anything else we can do in regards to this or any other matter, please do not hesitate to contact us.    This patient was seen and evaluated with Dr. Marland Mcalpine. We formulated the plan together.     Turner Daniels, M.D. PGY-4  Otolaryngology, Head and Neck Surgery  Service Pager: [phone] Pager: [phone]  PI : [id number]

## 2018-03-21 NOTE — Patient Instructions (Signed)
For dry skin:   - Cetaphil or Eucerin lotion several times per day   - Hypoallergenic/sensitive skin soap/body wash.   - Avoid HOT showers, try warm only   - pat dry with towel instead of rubbing skin.   - consider using "Free and Clear" detergents on all your clothes and bedding.

## 2018-03-21 NOTE — Nursing Note (Signed)
Vital signs taken, allergies verified, screened for pain, tobacco hx verified.    [name], MA II     I have asked the patient if they have received any medical treatment or consultation outside of the [facility].  The patient has indicated (yes or no) to receiving consults or services outside the [facility].  If yes, we have requested information to ascertain these results.

## 2018-03-21 NOTE — Progress Notes (Signed)
Chief Complaint   Patient presents with    Other     F/u on test results     Other     Pt reports dark spots on both upper arms x 2 wks         History of Present Illness:  Kayla Morris is a 37yr old female  here for chief complaint noted above.    Patient saw Dr. Nonnie Done (though I am currently unable to find his note) and reviewed ultrasound with him. She has ENT appointment tomorrow.   Feels node has reduced in size. No tenderness. No oral symptoms.   Labs were ordered in response to findings and were normal.       Rash on upper inner arms. New lotion. Otherwise, no new medications, foods, products. Not itchy or painful, just dark and spotty. On arms and a small amount on left neck.     ROS:  A full review of systems including: General, Respiratory, Cardiovascular, Gastrointestinal, Genitouinary, Hematologic, Musculoskeletal, Skin, Immunologic, Psychiatric, Neurologic has been reviewed and is negative except for the positives listed in HPI.     Past Medical History:   Diagnosis Date    Hypertension         Family History   Problem Relation Name Age of Onset    Hypertension Mother      Hypertension Sister          Social History     Tobacco Use    Smoking status: Never Smoker    Smokeless tobacco: Never Used   Substance Use Topics    Alcohol use: Yes     Frequency: 2-4 times a month     Drinks per session: 1 or 2     Binge frequency: Never        Current Outpatient Medications   Medication Sig    Triamcinolone (KENALOG) 0.1 % Cream Apply to the affected area 2 times daily. (rash)     No current facility-administered medications for this visit.         Allergies as of 03/21/2018    (No Known Allergies)        OBJECTIVE:   BP 134/80 (SITE: right arm, Orthostatic Position: sitting, Cuff Size: large)   Pulse 80   Temp 36.2 C (97.2 F) (Tympanic)   Ht 1.727 m (5\' 8" )   Wt 86.6 kg (190 lb 14.7 oz)   SpO2 99%   BMI 29.03 kg/m    Physical Exam:General Appearance: healthy, alert, no distress, pleasant  affect, cooperative.  Eyes:  conjunctivae and corneas clear.   Nose:  No drainage.  Mouth: MMM.  Neck:  Neck supple. Reduced size of left submandibular adenopathy.   Skin: hyperpigmented (dark brown/purplish) macules on inner arms, and a small patch of them on her neck.     PHQ-2 11/24/2017   Interest 0   Feeling 0   PHQ-2 Total Score 0        ASSESSMENT and PLAN:  (R21) Rash  (primary encounter diagnosis)  Comment:   - has had similar, previously.   - adenopathy resolving, favorable for reactive node rather than malignancy; considered possibility of derm paraneoplastic process, but given that she has also had this before, it is less likely.   Plan: Triamcinolone (KENALOG) 0.1 % Cream            (R59.1) Lymphadenopathy of head and neck  Comment:   -improving.   -establish with ENT tomorrow for poss FNA.  Plan:  I did review patient's past medical and family/social history, no changes noted.  Barriers to Learning assessed: none. Patient verbalizes understanding of teaching and instructions.  Education Needs Identified?   no    Health Self-Management goal reviewed today. Yes, Patient was referred for health self-management support to : n/a    Patient was counseled specifically on the following: diagnostic results, impressions, and/or recommended diagnostic studies reviewed, instructions for management and/or follow-up, risk factor reduction and patient and family education    I spent 15 minutes with the patient, >50% of which were spent counseling her regarding differential, additional testing, if indicated, potential treatment options, and lifestyle modifications, if applicable    Parts of this note may have been created with the support of Dragon dictation. Please accept any grammatical, sound-alike, or syntax errors as likely dictation errors.        Electronically signed by:  Marton Redwood, M.D.  Associate Physician  [facility]  Pgr: [phone]

## 2018-03-22 ENCOUNTER — Encounter: Payer: Self-pay | Admitting: Otolaryngology

## 2018-03-22 ENCOUNTER — Ambulatory Visit: Payer: 59 | Attending: Otolaryngology | Admitting: Otolaryngology

## 2018-03-22 VITALS — BP 135/85 | HR 65 | Temp 97.3°F | Ht 68.0 in | Wt 187.8 lb

## 2018-03-22 DIAGNOSIS — R591 Generalized enlarged lymph nodes: Secondary | ICD-10-CM

## 2018-03-22 DIAGNOSIS — R221 Localized swelling, mass and lump, neck: Secondary | ICD-10-CM

## 2018-03-22 DIAGNOSIS — R590 Localized enlarged lymph nodes: Principal | ICD-10-CM | POA: Insufficient documentation

## 2018-03-22 DIAGNOSIS — R220 Localized swelling, mass and lump, head: Secondary | ICD-10-CM

## 2018-03-22 NOTE — Nursing Note (Signed)
Patient identification verified x 2 Kayla Morris Kayla Morris 01-18-1981). Patient roomed, appropriate vitals obtained, pain level and barriers assessed, and  medications reconciled.    Garret Reddish, LVN

## 2018-03-22 NOTE — Progress Notes (Signed)
Thank you for allowing [place] to participate in the management of your patient, Kayla Morris.  She was seen by me and evaluated today, 03/22/2018 with my resident.  I have reviewed the chart for the past medical, surgical, family, social histories, medications, allergies, habits and review of systems.  I agree with the findings and plan that we have developed as outlined in the resident's note above.     Electronically signed   Tamitha Norell. Alben Spittle, M.Tatiyanna Lashley., [facility]  Professor and Chair  Department of Otolaryngology-Head and Neck Surgery  [place] [facility] [place], [place]  Pager [phone]

## 2018-03-22 NOTE — Progress Notes (Deleted)
PATIENT: Kayla Morris LOCATION[facility]   MR #: [id number] SEX: female  AGE: 57yr  DATE OF SERVICE: 03/22/2018  DOB: Aug 14, 1981    OTOLARYNGOLOGY CLINIC CONSULTATION NOTE      CHIEF COMPLAINT: ***    Dear Dr. Nonnie Done:    I had the pleasure of seeing Kayla Morris  in consultation on your extremely kind recommendation.  My full evaluation is below.    As you're aware, [name] is a very kind female with ***    Submandibular lymphadenopathy     PAST MEDICAL HISTORY:    Past Medical History:   Diagnosis Date    Hypertension        PAST SURGICAL HISTORY:    Past Surgical History:   Procedure Laterality Date    NO SURGICAL HISTORY         ALLERGIES:    Patient has no known allergies.    MEDICATIONS:    Current Outpatient Medications   Medication Sig    Triamcinolone (KENALOG) 0.1 % Cream Apply to the affected area 2 times daily. (rash)     No current facility-administered medications for this visit.        SOCIAL HISTORY:    Social History     Tobacco Use    Smoking status: Never Smoker    Smokeless tobacco: Never Used   Substance Use Topics    Alcohol use: Yes     Frequency: 2-4 times a month     Drinks per session: 1 or 2     Binge frequency: Never    Drug use: Never       REVIEW OF SYMPTOMS:    General:  No fever, chills, night sweats, weight gain or weight loss.  ***  HEENT:  No recent itchy / watery eyes.  No current otalgia or ear drainage.  No sore throat.  No epistaxis or anosmia. ***  Integumentary:  No new skin lesions or bleeding.  No skin blistering. ***  Neurologic:  No recent aphasia or dysarthria.  No new extremity weakness.  No new double vision.  Cardiovascular: No hypertension.  No CVA, deep vein thrombosis, or myocardial infarction.  No orthopnea, paroxysmal nocturnal dyspnea, or chest pain.    Pulmonary: No history of asthma, pneumonia, history of bronchitis.  No chronic cough or hemoptysis.    Gastrointestinal: No nausea and vomiting, constipation or diarrhea.  No blood in stools,  black stools, change in stools, or abdominal pain.    Genitourinary:  No polynephritis or nephrolithiasis.  No burning urination or discolored urination.  Endocrine: No heat intolerance, cold intolerance, polydipsia, polyphagia, or polyuria.  ***  Musculoskeletal: No muscle, bone, or joint problems.  No new muscle weakness.    Hematologic:  No easy bruising or bleeding.  Psychiatric:  No current suicidal or homicidal ideation.  No depressed mood.      PHYSICAL EXAMINATION:    General: The patient is well-developed, well-nourished and in no acute distress.     Skin: The skin is warm and dry.    Head: Normocephalic, atraumatic.     Eyes: Pupils equally round and reactive to light, extraocular movements are intact, conjunctiva and sclera are clear.     Ears: External auditory canals are clear bilaterally. Tympanic membranes are intact and without evidence of middle ear effusion or infection.     Nasal exam: Nares are patent and without discharge, anterior septum is without  significant deviation and nasal mucosa is normal without lesions or masses.  Oral cavity / oropharynx: Mucosa normal and without evidence of lesions or ulcerations. Tongue normal and without masses. Floor of mouth and retromolar trigone without evidence of lesions. Oropharynx exam is without mucosal lesion or induration.     Larynx and hypopharynx:  negative by mirror examination. Vocal folds mobile, cannot visualize anterior half. ***    Neck: Supple and without adenopathy. ***    Respiratory: The patient is breathing comfortably in no acute distress.     Neurologic exam: Cranial nerves II-XII are grossly intact.       LABS:  ***      PATHOLOGY:  ***      IMAGING:  02/28/18 Head/Neck US   FINDINGS:  Multiple lymph nodes in the submandibular region account for the palpable  abnormality. The largest node is 3.3 cm in long axis and has normal  reniform morphology but thickened cortex and lobulated contour. Some of the  other nodes have more rounded  morphology.    IMPRESSION:  1. Submandibular lymphadenopathy, which could be reactive or malignant.  Correlate for signs of infection or malignancy in the mouth and pharynx.  Consider fine needle aspiration and biopsy of the lymph nodes if there is  suspicion of cancer or if they do not regress with conservative treatment.      IMPRESSION/PLAN:      Kayla Morris is a 37yr old female:    1. ***   - ***    2. ***   - ***       - Return to clinic in ***. Patient was instructed to contact us immediately for any new or worsening symptoms.      Barriers to Learning assessed: None. Patient verbalizes understanding of teaching and instructions.    Thank you very much for allowing our team to participate in the medical care of this pleasant patient. If there is anything else we can do in regards to this or any other matter, please do not hesitate to contact me.    SCRIBE DISCLAIMER:   I, Sherald Barge, SCRIBE, a trained medical scribe, scribed this noted in the presence of Dr. Alben Spittle, M.D., [place].    Electronically signed by Sherald Barge, SCRIBE, 03/22/2018   ***      CC:  Marton Redwood, MD (PCP)  Clovia Cuff, MD (Referring provider, ***)

## 2019-01-25 ENCOUNTER — Encounter: Payer: Self-pay | Admitting: Family Medicine

## 2019-01-25 NOTE — Telephone Encounter (Signed)
From: [name]  To: [name], MD  Sent: 01/25/2019 9:00 AM PDT  Subject: Non-urgent Medical Advice Question    Good morning,    My blood pressure has recently elevated and this past weekend I went to an Urgent Care Facility due to my symptoms of dizziness and nausea. They refilled the last medication I was taking which is Amlodipine, 5 mg. I started taking the medication Sunday April 26 and feel much better. I did check my pressure last night, April 28 and it read 139/95. That still seems to be pretty high to me but I'm not sure. Please advise.    Kayla Morris

## 2019-01-25 NOTE — Telephone Encounter (Signed)
Patient has an appointment 5/4/220 with Dr. Rosana Hoes.   Consuella Lose, RN  PCN Triage

## 2019-01-30 ENCOUNTER — Encounter: Payer: Self-pay | Admitting: Family Medicine

## 2019-01-30 ENCOUNTER — Ambulatory Visit: Payer: 59 | Admitting: Family Medicine

## 2019-01-30 VITALS — BP 138/78 | HR 69 | Temp 98.2°F | Ht 68.0 in | Wt 194.4 lb

## 2019-01-30 DIAGNOSIS — I10 Essential (primary) hypertension: Secondary | ICD-10-CM

## 2019-01-30 MED ORDER — AMLODIPINE 5 MG TABLET
5.0000 mg | ORAL_TABLET | Freq: Every day | ORAL | 3 refills | Status: DC
Start: 2019-01-30 — End: 2019-09-12

## 2019-01-30 NOTE — Nursing Note (Signed)
Vital signs taken, allergies verified, screened for pain, tobacco hx verified.    Kayla Foulk Carrillo Bradford MA I    I have asked the patient if they have received any medical treatment or consultation outside of the [facility].  The patient has indicated (yes or no) to receiving consults or services outside the [facility] system.  If yes, we have requested information to ascertain these results.

## 2019-01-30 NOTE — Progress Notes (Signed)
Chief Complaint   Patient presents with    Blood Pressure     blood pressure was high (153/101) 2 weeks ago not that high since no sx          History of Present Illness:  Kayla Morris is a 38yr old female  here for chief complaint noted above.    PPE documentation needed: Patient WAS wearing a surgical mask  Droplet precautions were followed when caring for the patient.   PPE used by provider during encounter: Surgical mask     Light-headed 2 weeks ago. Nauseated. Went to urgent care. blood pressure was high. They refilled her amlodipine and blood pressure was improved.   Hasn't felt sick since.   Denies dizziness since resuming medication.   Currently asymptomatic.         ROS:  As noted in HPI.     Past Medical History:   Diagnosis Date    Hypertension         Family History   Problem Relation Name Age of Onset    Hypertension Mother      Hypertension Sister          Social History     Tobacco Use    Smoking status: Never Smoker    Smokeless tobacco: Never Used   Substance Use Topics    Alcohol use: Yes     Frequency: 2-4 times a month     Drinks per session: 1 or 2     Binge frequency: Never        Current Outpatient Medications   Medication Sig    PNV TD.17/OHYWVPX fum/folic ac (PRENATAL PO)     Triamcinolone (KENALOG) 0.1 % Cream Apply to the affected area 2 times daily. (rash)     No current facility-administered medications for this visit.         Allergies as of 01/30/2019    (No Known Allergies)        OBJECTIVE:   BP 138/78 (SITE: left arm, Orthostatic Position: sitting, Cuff Size: large)   Pulse 69   Temp 36.8 C (98.2 F) (Oral)   Ht 1.727 m (5\' 8" )   Wt 88.2 kg (194 lb 7.1 oz)   LMP 01/15/2019   SpO2 98%   BMI 29.57 kg/m    Physical Exam:General Appearance: healthy, alert, no distress, pleasant affect, cooperative.  Eyes:  conjunctivae and corneas clear.   Nose:  No drainage.  Mouth: MMM.  Neck:  Neck supple. Marland Kitchen  Heart:  normal rate and regular rhythm, no murmurs, clicks, or  gallops.  Lungs: clear to auscultation, no wheezes, rales, rhonchi.  Extremities:  no edema.             ASSESSMENT and PLAN:  (I10) Essential hypertension  (primary encounter diagnosis)  Comment:   - at goal since resuming amlodipine.   - follow up prn  Plan: Amlodipine (NORVASC) 5 mg Tablet               I did review patient's past medical and family/social history, no changes noted.  Barriers to Learning assessed: none. Patient verbalizes understanding of teaching and instructions.  Education Needs Identified?   no      Patient was counseled specifically on the following: diagnostic results, impressions, and/or recommended diagnostic studies reviewed, instructions for management and/or follow-up, risk factor reduction and patient and family education    I spent 15 minutes with the patient, >50% of which were spent counseling her regarding differential, additional  testing, if indicated, potential treatment options, and lifestyle modifications, if applicable    Parts of this note may have been created with the support of Dragon dictation.  Please accept any grammatical, sound-alike, or syntax errors as likely dictation errors.        Electronically signed by:  Marton Redwood, M.D.  Associate Physician  [facility]  Pgr: [phone]

## 2019-03-08 ENCOUNTER — Ambulatory Visit: Payer: 59 | Admitting: Family Medicine

## 2019-03-09 ENCOUNTER — Encounter: Payer: Self-pay | Admitting: Family Medicine

## 2019-03-09 ENCOUNTER — Ambulatory Visit: Payer: 59 | Attending: Cardiovascular Disease | Admitting: Family Medicine

## 2019-03-09 VITALS — BP 117/76 | HR 78 | Temp 97.4°F | Ht 68.0 in | Wt 192.7 lb

## 2019-03-09 DIAGNOSIS — I493 Ventricular premature depolarization: Secondary | ICD-10-CM

## 2019-03-09 DIAGNOSIS — R600 Localized edema: Secondary | ICD-10-CM | POA: Insufficient documentation

## 2019-03-09 DIAGNOSIS — I491 Atrial premature depolarization: Secondary | ICD-10-CM

## 2019-03-09 DIAGNOSIS — R002 Palpitations: Secondary | ICD-10-CM | POA: Insufficient documentation

## 2019-03-09 NOTE — Progress Notes (Signed)
Chief Complaint   Patient presents with    Physical     due here for annual physical and pap         History of Present Illness:  Kayla Morris is a 38yr old female  here for chief complaint noted above.    PPE documentation needed: Patient WAS wearing a surgical mask  Droplet precautions were followed when caring for the patient.   PPE used by provider during encounter: Surgical mask     D/w patient we would postpone her physical as her pap and HPV appear to be up to date and I am more concerned with her palpitations and LE edema concerns. She is in agreement. 4    LE edema  Mom said her feet look swollen and she should get a physical to find out why.   Patient doesn't feel they look particularly swollen, shoes doen't feel tight.   Denies increased salt intake. No DOE.     Papitations. Intermittently x several months.   Seemed to improve after changing coffee to black tea.   EtOH intake about twice /week, 2-3 drinks. Doesn't seem to impact palpitations.     ROS:  As noted in HPI.     Past Medical History:   Diagnosis Date    Hypertension         Family History   Problem Relation Name Age of Onset    Hypertension Mother      Hypertension Sister          Social History     Tobacco Use    Smoking status: Never Smoker    Smokeless tobacco: Never Used   Substance Use Topics    Alcohol use: Yes     Frequency: 2-4 times a month     Drinks per session: 1 or 2     Binge frequency: Never        Current Outpatient Medications   Medication Sig    Amlodipine (NORVASC) 5 mg Tablet Take 1 tablet by mouth every day.    PNV BZ.16/RCVELFY fum/folic ac (PRENATAL PO)     Triamcinolone (KENALOG) 0.1 % Cream Apply to the affected area 2 times daily. (rash)     No current facility-administered medications for this visit.         Allergies as of 03/09/2019    (No Known Allergies)        OBJECTIVE:   BP 117/76 (SITE: left arm, Orthostatic Position: sitting, Cuff Size: regular)   Pulse 78   Temp 36.3 C (97.4 F) (Tympanic)    Ht 1.727 m (5\' 8" )   Wt 87.4 kg (192 lb 10.9 oz)   LMP 03/04/2019   SpO2 100%   BMI 29.30 kg/m    Physical Exam:General Appearance: healthy, alert, no distress, pleasant affect, cooperative.  Eyes:  conjunctivae and corneas clear.   Nose:  No drainage.  Mouth: MMM.  Neck:  Neck supple. Marland Kitchen  Heart:  normal rate and regular rhythm, no murmurs, clicks, or gallops.  Lungs: clear to auscultation, no wheezes, rales, rhonchi.  Extremities:  no edema. Normal cap refill.         EKG: NSR, normal axis, non-specific T wave abn.     ASSESSMENT and PLAN:  (R00.2) Palpitations  (primary encounter diagnosis)  Comment:   - normal TSH and chemistry panel.   - EKG reassuring.   - holter placed today.   Plan: POC ELECTROCARDIOGRAM WITH RHYTHM STRIP, HOLTER  MONITOR            (R60.0) Lower extremity edema  Comment:   - resolved.   Plan:        I did review patient's past medical and family/social history, no changes noted.  Barriers to Learning assessed: none. Patient verbalizes understanding of teaching and instructions.  Education Needs Identified?   no      Patient was counseled specifically on the following: diagnostic results, impressions, and/or recommended diagnostic studies reviewed, instructions for management and/or follow-up, risk factor reduction and patient and family education    I spent 25 minutes with the patient, >50% of which were spent counseling her regarding differential, additional testing, if indicated, potential treatment options, and lifestyle modifications, if applicable    Parts of this note may have been created with the support of Dragon dictation.  Please accept any grammatical, sound-alike, or syntax errors as likely dictation errors.        Electronically signed by:  Marton Redwood, M.D.  Associate Physician  [facility]  Pgr: [phone]

## 2019-03-09 NOTE — Patient Instructions (Signed)
Palpitations: Care Instructions  Your Care Instructions     Heart palpitations are the uncomfortable sensation that your heart is beating fast or irregularly. You might feel pounding or fluttering in your chest. It might feel like your heart is skipping a beat.  Although palpitations may be caused by a heart problem, they also occur because of stress, fatigue, or use of alcohol, caffeine, or nicotine. Many medicines, including diet pills, antihistamines, decongestants, and some herbal products, can cause heart palpitations. Nearly everyone has palpitations from time to time.  Depending on your symptoms, your doctor may need to do more tests to try to find the cause of your palpitations.  Follow-up care is a key part of your treatment and safety. Be sure to make and go to all appointments, and call your doctor if you are having problems. It's also a good idea to know your test results and keep a list of the medicines you take.  How can you care for yourself at home?   Avoid caffeine, nicotine, and excess alcohol.   Do not take illegal drugs, such as methamphetamines and cocaine.   Do not take weight loss or diet medicines unless you talk with your doctor first.   Get plenty of sleep.   Do not overeat.   If you have palpitations again, take deep breaths and try to relax. This may slow a racing heart.   If you start to feel lightheaded, lie down to avoid injuries that might result if you pass out and fall down.   Keep a record of your palpitations and bring it to your next doctor's appointment. Write down:   The date and time.   Your pulse. (If your heart is beating fast, it may be hard to count your pulse.)   What you were doing when the palpitations started.   How long the palpitations lasted.   Any other symptoms.   If an activity causes palpitations, slow down or stop. Talk to your doctor before you do that activity again.   Take your medicines exactly as prescribed. Call your doctor if you think  you are having a problem with your medicine.  When should you call for help?  Call 911 anytime you think you may need emergency care. For example, call if:   You passed out (lost consciousness).   You have symptoms of a heart attack. These may include:   Chest pain or pressure, or a strange feeling in the chest.   Sweating.   Shortness of breath.   Pain, pressure, or a strange feeling in the back, neck, jaw, or upper belly or in one or both shoulders or arms.   Lightheadedness or sudden weakness.   A fast or irregular heartbeat.  After you call 911, the operator may tell you to chew 1 adult-strength or 2 to 4 low-dose aspirin. Wait for an ambulance. Do not try to drive yourself.   You have signs of a stroke. These may include:   Sudden numbness, paralysis, or weakness in your face, arm, or leg, especially on only one side of your body.   New problems with walking or balance.   Sudden vision changes.   Drooling or slurred speech.   New problems speaking or understanding simple statements, or feeling confused.   A sudden, severe headache that is different from past headaches.  Call your doctor now or seek immediate medical care if:   You have heart palpitations and:   Are dizzy or lightheaded, or you   feel like you may faint.   Have new or increased shortness of breath.  Watch closely for changes in your health, and be sure to contact your doctor if:   You continue to have heart palpitations.   Where can you learn more?   Go to http://blackburn.com/  Enter R508 in the search box to learn more about "Palpitations: Care Instructions."    2006-2015 Healthwise, Incorporated. Care instructions adapted under license by [facility]. This care instruction is for use with your licensed healthcare professional. If you have questions about a medical condition or this instruction, always ask your healthcare professional. [place] any warranty or liability for  your use of this information.  Content Version: 10.6.465758; Current as of: January 18, 2014              Holter Monitoring: About This Test  What is it?  A Holter monitor is a small machine that records the electrical activity of your heart. You wear it for 24 to 72 hours while you do all your normal activities.  The monitor has wires that attach to small electrode discs. These discs are taped to your chest.  This kind of machine has many different names. It is sometimes called an ambulatory monitor, an ambulatory electrocardiogram, or an ambulatory EKG. It can also be called a 24-hour EKG or a cardiac event monitor.  Why is this test done?  You may have this test to find out if you have a problem with your heart. Many heart problems can only be noticed when you are doing something. They may happen when you exercise, eat, have sex, or sleep. Or they may happen when you have a bowel movement or you feel stressed. Your Holter monitor will record the way your heart beats during all of these activities.  Holter monitoring also will:   Look for what may cause chest pain, dizziness, or fainting.   Look for poor blood flow to your heart. This is called ischemia.   Check to see if treatment for an irregular heartbeat is working.  How can you prepare for the test?   Before the test, talk to your doctor about all your health problems. Tell him or her about all the medicines and vitamins you take.   Take a shower or bath before the discs are put onto your chest. You must not get the discs wet during the test.   Wear a loose blouse or shirt.   Do not wear jewelry or clothes with metal buttons or buckles.   If you are a woman, do not wear an underwire bra.  What happens before the test?   Areas of your chest may be shaved and cleaned.   The electrode discs are attached to your chest with a paste or gel.   You wear the monitor on a strap over your shoulder or around your waist. It uses batteries and does not weigh  much.  What happens during the test?   You need to record your activities and symptoms. You will write down the time your symptoms started. And you will write down what type of activity you were doing.   You can use the clock on the monitor to help you keep track of the time your symptoms started.   When you sleep, try to stay on your back with the monitor at your side. This will prevent the discs from coming off.  What else should you know about the test?  When you wear the monitor, try to stay away from magnets, metal detectors, high-voltage areas, garage door openers, microwave ovens, and electric blankets.   Do not use an electric toothbrush or shaver.   The discs may make your skin itch a little. And your skin may look or feel irritated after the discs are removed.  How long does the test take?   You usually wear the monitor for 24 to 72 hours.  What happens after the test?   You may return to the doctor's office or hospital to have the discs removed. Or you may be able to take them off yourself.   You will return the Holter monitor to your doctor's office or hospital.   You can go back to your usual activities right away.   Where can you learn more?   Go to http://blackburn.com/  Enter [phone] in the search box to learn more about "Holter Monitoring: About This Test."    2006-2015 Healthwise, Incorporated. Care instructions adapted under license by [facility]. This care instruction is for use with your licensed healthcare professional. If you have questions about a medical condition or this instruction, always ask your healthcare professional. [place] any warranty or liability for your use of this information.  Content Version: 10.6.465758; Current as of: November 17, 2013              Leg and Ankle Edema: Care Instructions  Your Care Instructions  Swelling in the legs, ankles, and feet is called edema. It is common after you sit or stand for a  while. Long plane flights or car rides often cause swelling in the legs and feet. You may also have swelling if you have to stand for long periods of time at your job. Problems with the veins in the legs (varicose veins) and changes in hormones can also cause swelling. Sometimes the swelling in the ankles and feet is caused by a more serious problem, such as heart failure, infection, blood clots, or liver or kidney disease.  Follow-up care is a key part of your treatment and safety. Be sure to make and go to all appointments, and call your doctor if you are having problems. It's also a good idea to know your test results and keep a list of the medicines you take.  How can you care for yourself at home?   If your doctor gave you medicine, take it as prescribed. Call your doctor if you think you are having a problem with your medicine.   Whenever you are resting, raise your legs up. Try to keep the swollen area higher than the level of your heart.   Take breaks from standing or sitting in one position.   Walk around to increase the blood flow in your lower legs.   Move your feet and ankles often while you stand, or tighten and relax your leg muscles.   Wear support stockings. Put them on in the morning, before swelling gets worse.   Eat a balanced diet. Lose weight if you need to.   Limit the amount of salt (sodium) in your diet. Salt holds fluid in the body and may increase swelling.  When should you call for help?  Call 911 anytime you think you may need emergency care. For example, call if:   You have symptoms of a blood clot in your lung (called a pulmonary embolism). These may include:   Sudden chest pain.   Trouble breathing.   Coughing  up blood.  Call your doctor now or seek immediate medical care if:   You have signs of a blood clot, such as:   Pain in your calf, back of the knee, thigh, or groin.   Redness and swelling in your leg or groin.   You have symptoms of infection, such  as:   Increased pain, swelling, warmth, or redness.   Red streaks or pus.   A fever.  Watch closely for changes in your health, and be sure to contact your doctor if:   Your swelling is getting worse.   You have new or worsening pain in your legs.   You do not get better as expected.   Where can you learn more?   Go to http://blackburn.com/  Enter [phone] in the search box to learn more about "Leg and Ankle Edema: Care Instructions."    2006-2015 Healthwise, Incorporated. Care instructions adapted under license by [facility]. This care instruction is for use with your licensed healthcare professional. If you have questions about a medical condition or this instruction, always ask your healthcare professional. [place] any warranty or liability for your use of this information.  Content Version: 10.6.465758; Current as of: Feb 16, 2014

## 2019-03-09 NOTE — Nursing Note (Signed)
Vital signs taken, allergies verified, screened for pain, tobacco hx verified.    Sonal Dorwart MA    I have asked the patient if they have received any medical treatment or consultation outside of the [facility].  The patient has indicated no to receiving consults or services outside the [facility] system.  If yes, we have requested information to ascertain these results. Corene Resnick MA

## 2019-03-13 ENCOUNTER — Telehealth: Payer: Self-pay | Admitting: Family Medicine

## 2019-03-13 NOTE — Telephone Encounter (Signed)
FYI:      General Advice / Message to MD:    Patient came into office to drop off Holter. Placed in nurse's desk.      [[facility]:  Please obtain basic information such as duration (how long have you had this problem?), location (Where is the pain?), Route to MA pool].

## 2019-03-15 ENCOUNTER — Ambulatory Visit
Admission: RE | Admit: 2019-03-15 | Discharge: 2019-03-15 | Disposition: A | Discharge status: Home / Self Care | Payer: 59 | Source: Ambulatory Visit

## 2019-03-15 NOTE — Procedures (Signed)
Holter study for patient Kayla Morris was scanned and will be sent to physician's in box (Holter system) for review and final interpretation.

## 2019-03-17 LAB — POC ELECTROCARDIOGRAM WITH RHYTHM STRIP: QTC: 425

## 2019-03-27 ENCOUNTER — Encounter: Payer: Self-pay | Admitting: Family Medicine

## 2019-03-27 ENCOUNTER — Ambulatory Visit: Payer: 59 | Admitting: Family Medicine

## 2019-03-27 VITALS — BP 127/83 | HR 69 | Temp 98.5°F | Ht 68.0 in | Wt 192.9 lb

## 2019-03-27 DIAGNOSIS — J45909 Unspecified asthma, uncomplicated: Secondary | ICD-10-CM | POA: Insufficient documentation

## 2019-03-27 DIAGNOSIS — K649 Unspecified hemorrhoids: Secondary | ICD-10-CM

## 2019-03-27 MED ORDER — HYDROCORTISONE ACETATE 25 MG RECTAL SUPPOSITORY
25.0000 mg | Freq: Two times a day (BID) | RECTAL | 0 refills | Status: DC
Start: 2019-03-27 — End: 2019-06-06

## 2019-03-27 NOTE — Nursing Note (Signed)
Patient WAS wearing a surgical mask  Droplet precautions were followed when caring for the patient.   PPE used by provider during encounter: Surgical mask   Vital signs taken, allergies verified, screened for pain, tobacco hx verified.    Darnelle Maffucci MA  I have asked the patient if they have received any medical treatment or consultation outside of the [facility].  The patient has indicated no to receiving consults or services outside the [facility] system.  If yes, we have requested information to ascertain these results. Darnelle Maffucci MA

## 2019-03-27 NOTE — Patient Instructions (Addendum)
Hemorrhoids: Care Instructions  Your Care Instructions     Hemorrhoids are enlarged veins that develop in the anal canal. Bleeding during bowel movements, itching, swelling, and rectal pain are the most common symptoms. They can be uncomfortable at times, but hemorrhoids rarely are a serious problem.  You can treat most hemorrhoids with simple changes to your diet and bowel habits. These changes include eating more fiber and not straining to pass stools. Most hemorrhoids do not need surgery or other treatment unless they are very large and painful or bleed a lot.  Follow-up care is a key part of your treatment and safety. Be sure to make and go to all appointments, and call your doctor if you are having problems. It's also a good idea to know your test results and keep a list of the medicines you take.  How can you care for yourself at home?   Sit in a few inches of warm water (sitz bath) 3 times a day and after bowel movements. The warm water helps with pain and itching.   Put ice on your anal area several times a day for 10 minutes at a time. Put a thin cloth between the ice and your skin. Follow this by placing a warm, wet towel on the area for another 10 to 20 minutes.   Take pain medicines exactly as directed.   If the doctor gave you a prescription medicine for pain, take it as prescribed.   If you are not taking a prescription pain medicine, ask your doctor if you can take an over-the-counter medicine.   Keep the anal area clean, but be gentle. Use water and a fragrance-free soap, such as Ivory, or use baby wipes or medicated pads, such as Tucks.   Wear cotton underwear and loose clothing to decrease moisture in the anal area.   Eat more fiber. Include foods such as whole-grain breads and cereals, raw vegetables, raw and dried fruits, and beans.   Drink plenty of fluids, enough so that your urine is light yellow or clear like water. If you have kidney, heart, or liver disease and have to limit  fluids, talk with your doctor before you increase the amount of fluids you drink.   Use a stool softener that contains bran or psyllium. You can save money by buying bran or psyllium (available in bulk at most health food stores) and sprinkling it on foods or stirring it into fruit juice. Or you can use a product such as Metamucil or Hydrocil.   Practice healthy bowel habits.   Go to the bathroom as soon as you have the urge.   Avoid straining to pass stools. Relax and give yourself time to let things happen naturally.   Do not hold your breath while passing stools.   Do not read while sitting on the toilet. Get off the toilet as soon as you have finished.   Take your medicines exactly as prescribed. Call your doctor if you think you are having a problem with your medicine.  When should you call for help?  Call 911 anytime you think you may need emergency care. For example, call if:   You pass maroon or very bloody stools.  Call your doctor now or seek immediate medical care if:   You have increased pain.   You have increased bleeding.  Watch closely for changes in your health, and be sure to contact your doctor if:   Your symptoms have not improved after 3 or 4   days.   Where can you learn more?   Go to https://www.healthwise.net/patiented  Enter F228 in the search box to learn more about "Hemorrhoids: Care Instructions."    2006-2015 [employer]. Care instructions adapted under license by [facility]. This care instruction is for use with your licensed healthcare professional. If you have questions about a medical condition or this instruction, always ask your healthcare professional. Healthwise, Incorporated disclaims any warranty or liability for your use of this information.  Content Version: 10.6.465758; Current as of: August 11, 2013

## 2019-03-27 NOTE — Progress Notes (Signed)
Chief Complaint   Patient presents with    Hemorrhoids     during bowel movement pt gets itchiness and irritation for awhile comes & goes and a bump on rectal area         History of Present Illness:  Kayla Morris is a 38yr old female  here for chief complaint noted above.    Patient was wearing a cloth mask.   Droplet precautions were followed when caring for the patient.   PPE used by provider during encounter: Surgical mask     Ongoing anal irritation. Mostly itching. Seen for hemorrhoid in the past, thought to be related to constipation, though not consitpatied at this time. Still taking stool softener intermittently. No diarrhea.       ROS:  As noted in HPI.     Past Medical History:   Diagnosis Date    Hypertension         Family History   Problem Relation Name Age of Onset    Hypertension Mother      Hypertension Sister          Social History     Tobacco Use    Smoking status: Never Smoker    Smokeless tobacco: Never Used   Substance Use Topics    Alcohol use: Yes     Frequency: 2-4 times a month     Drinks per session: 1 or 2     Binge frequency: Never        Current Outpatient Medications   Medication Sig    Amlodipine (NORVASC) 5 mg Tablet Take 1 tablet by mouth every day.    PNV DD.22/GURKYHC fum/folic ac (PRENATAL PO)     Triamcinolone (KENALOG) 0.1 % Cream Apply to the affected area 2 times daily. (rash)     No current facility-administered medications for this visit.         Allergies as of 03/27/2019    (No Known Allergies)        OBJECTIVE:   BP 127/83 (SITE: left arm, Orthostatic Position: sitting, Cuff Size: large)   Pulse 69   Temp 36.9 C (98.5 F) (Tympanic)   Ht 1.727 m (5\' 8" )   Wt 87.5 kg (192 lb 14.4 oz)   LMP 03/04/2019   SpO2 100%   BMI 29.33 kg/m    Physical Exam:General Appearance: healthy, alert, no distress, pleasant affect, cooperative.  Eyes:  conjunctivae and corneas clear.   Nose:  No drainage.  Mouth: MMM.  Neck:  Neck supple.  Rectal exam: external  hemorrhoid tag noted, no masses. + internal hemorrhoids on anoscopy, posterior.       ASSESSMENT and PLAN:  (K64.9) Hemorrhoids, unspecified hemorrhoid type  (primary encounter diagnosis)  Comment:   - dicussed ongoing use of stool softeners, anusol suppository (patient prefers suppository over cream at this time).   Plan: Hydrocortisone (ANUSOL-HC) 25 mg Suppository               I did review patient's past medical and family/social history, no changes noted.  Barriers to Learning assessed: none. Patient verbalizes understanding of teaching and instructions.  Education Needs Identified?   no      Patient was counseled specifically on the following: diagnostic results, impressions, and/or recommended diagnostic studies reviewed, instructions for management and/or follow-up, risk factor reduction and patient and family education    I spent 15 minutes with the patient, >50% of which were spent counseling her regarding differential, additional testing, if indicated, potential treatment options,  and lifestyle modifications, if applicable    Parts of this note may have been created with the support of Dragon dictation.  Please accept any grammatical, sound-alike, or syntax errors as likely dictation errors.        Electronically signed by:  Marton Redwood, M.D.  Associate Physician  [facility]  Pgr: [phone]

## 2019-05-26 ENCOUNTER — Ambulatory Visit: Payer: 59 | Admitting: Family Medicine

## 2019-06-06 ENCOUNTER — Encounter: Payer: Self-pay | Admitting: Family Medicine

## 2019-06-06 ENCOUNTER — Ambulatory Visit: Payer: 59 | Admitting: Family Medicine

## 2019-06-06 VITALS — BP 137/84 | HR 96 | Temp 97.7°F | Wt 175.3 lb

## 2019-06-06 DIAGNOSIS — Z23 Encounter for immunization: Secondary | ICD-10-CM

## 2019-06-06 DIAGNOSIS — K649 Unspecified hemorrhoids: Secondary | ICD-10-CM

## 2019-06-06 MED ORDER — HYDROCORTISONE ACETATE 25 MG RECTAL SUPPOSITORY
25.0000 mg | Freq: Two times a day (BID) | RECTAL | 0 refills | Status: AC
Start: 2019-06-06 — End: 2020-05-31

## 2019-06-06 NOTE — Nursing Note (Signed)
Patient WAS wearing a surgical mask  Contact precautions were followed when caring for the patient.   PPE used by provider during encounter: Surgical mask and Face Shield/Goggles  Vital signs taken, allergies verified, screened for pain, tobacco hx verified.    Darnelle Maffucci MA  I have asked the patient if they have received any medical treatment or consultation outside of the [facility].  The patient has indicated on to receiving consults or services outside the [facility] system.  If yes, we have requested information to ascertain these results. Darnelle Maffucci MA      Vaccines Flu Vaccine verified and given by Darnelle Maffucci MA.    The patient has received a flu vaccine previously: yes  The Influenza Vaccine VIS document for the flu injection was given to patient to review. Patient or person named in permission has answered no to any history of egg allergy, previous serious reaction to a influenza vaccine or current illness which would preclude them receiving an immunization via protocol and policy [id number] without additional physician input. Any questions were referred to the physician. The Influenza Vaccine was then administered per protocol or by Policy [id number] using the standing order from Dr. Naomie Dean (Academic) or Dr. Katherina Mires (Network). The patient was observed for 0 minutes for immediate reactions to the vaccine per protocol. no reaction was observed.   Darnelle Maffucci, [place]

## 2019-06-06 NOTE — Progress Notes (Signed)
Chief Complaint   Patient presents with    Hemorrhoids     during bowel movement on saturday a lump came out- bump is present on anus area and painful         History of Present Illness:  Kayla Morris is a 38yr old female  here for chief complaint noted above.    Patient was wearing a surgical mask.   Droplet precautions were followed when caring for the patient.   PPE used by provider during encounter: Surgical mask and face shield.     Hemorrhoid; flaring up again.   Eating more veggies and fruits so noticing more BMs. Has been hard to sit down because of the pain.   Normal BM on Saturday, the day the pain started and hemorrhoid became noticeable.   Still using stool softener, drinking plenty of water, and using anusol suppositories.     ROS:  As noted in HPI.     Past Medical History:   Diagnosis Date    Hypertension         Family History   Problem Relation Name Age of Onset    Hypertension Mother      Hypertension Sister          Social History     Tobacco Use    Smoking status: Never Smoker    Smokeless tobacco: Never Used   Substance Use Topics    Alcohol use: Yes     Frequency: 2-4 times a month     Drinks per session: 1 or 2     Binge frequency: Never        Current Outpatient Medications   Medication Sig    Amlodipine (NORVASC) 5 mg Tablet Take 1 tablet by mouth every day.    Hydrocortisone (ANUSOL-HC) 25 mg Suppository Insert 1 suppository into the rectum every 12 hours.    PNV [id number] fum/folic ac (PRENATAL PO)     Triamcinolone (KENALOG) 0.1 % Cream Apply to the affected area 2 times daily. (rash)     No current facility-administered medications for this visit.         Allergies as of 06/06/2019    (No Known Allergies)        OBJECTIVE:   BP 137/84 (SITE: left arm, Orthostatic Position: sitting, Cuff Size: regular)   Pulse 96   Temp 36.5 C (97.7 F) (Tympanic)   Wt 79.5 kg (175 lb 4.3 oz)   LMP 05/21/2019   SpO2 99%   BMI 26.65 kg/m    Physical Exam:General Appearance:  healthy, alert, no distress, pleasant affect, cooperative.  Eyes:  conjunctivae and corneas clear.   Nose:  No drainage.  Mouth: MMM.  Neck:  Neck supple.  Rectal: large (1cm x 0.5cm) external hemorrhoid, 6 oclock, posterior. Negative anoscopy, limited by stool. Negative DRE.       ASSESSMENT and PLAN:  (K64.9) Hemorrhoids, unspecified hemorrhoid type  (primary encounter diagnosis)  Comment:   - recurrent issue, now with large external painful hemorrhoid.  - patient using appropriate conservative measures  - refer to surgery for definitve management.   Plan: Surgery - Gastroenterology Referral,         Hydrocortisone (ANUSOL-HC) 25 mg Suppository               I did review patient's past medical and family/social history, no changes noted.  Barriers to Learning assessed: none. Patient verbalizes understanding of teaching and instructions.  Education Needs Identified?   no  Patient was counseled specifically on the following: diagnostic results, impressions, and/or recommended diagnostic studies reviewed, instructions for management and/or follow-up, risk factor reduction and patient and family education    I spent 15 minutes with the patient, >50% of which were spent counseling her regarding differential, additional testing, if indicated, potential treatment options, and lifestyle modifications, if applicable    Parts of this note may have been created with the support of Dragon dictation.  Please accept any grammatical, sound-alike, or syntax errors as likely dictation errors.        Electronically signed by:  Marton Redwood, M.D.  Associate Physician  [facility]  Pgr: [phone]

## 2019-06-19 ENCOUNTER — Encounter: Payer: Self-pay | Admitting: Family Medicine

## 2019-06-19 NOTE — Telephone Encounter (Signed)
Shirkeri, can you please help patient get scheduled with Surgery? Thanks so much!

## 2019-06-21 NOTE — Progress Notes (Signed)
Colon and Rectal [facility] Clinic - Consult H&P   Attending: Dr. [name] [phone], [phone])   Date of Appointment: 06/23/2019         CHIEF COMPLAINT / REASON FOR CONSULT     Hemorrhoids    SUBJECTIVE     Kayla Morris is a 49yr female presenting to the Colon and Rectal Surgery clinic today for hemorrhoid evaluation. She was referred to Dr. [name] by Dr. Rosana Hoes. Patient visited Dr. Rosana Hoes on 06/06/19 with complaints of recurrent external hemorrhoids with symptoms of rectal pain. She has attempted conservative measures and would like evaluation for definitive treatment.    The patient reports feeling something "sticking out" in her perirectal area. She notes that the area has been swollen and painful for about 2 weeks symptoms, but since have been improving. She reports having difficulty with hygiene. Denies bloody stool or blood on the toilet paper or in the commode. She states having 2 bowel movements daily with occasional straining. Denies having a previous colonoscopy.      COLORECTAL SURGICAL HISTORY     None.     PAST MEDICAL HISTORY     Active Ambulatory Problems     Diagnosis Date Noted    Asthma 03/27/2019     Resolved Ambulatory Problems     Diagnosis Date Noted    No Resolved Ambulatory Problems     Past Medical History:   Diagnosis Date    Hypertension         PAST SURGICAL HISTORY     Past Surgical History:   Procedure Laterality Date    NO SURGICAL HISTORY          MEDICATIONS     Current Outpatient Medications on File Prior to Visit   Medication    Amlodipine (NORVASC) 5 mg Tablet    Hydrocortisone (ANUSOL-HC) 25 mg Suppository    PNV [id number] fum/folic ac (PRENATAL PO)    Triamcinolone (KENALOG) 0.1 % Cream     No current facility-administered medications on file prior to visit.         ALLERGIES     No Known Allergies     SOCIAL HISTORY     Social History     Tobacco Use    Smoking status: Never Smoker    Smokeless tobacco: Never Used   Substance Use Topics    Alcohol use: Yes      Frequency: 2-4 times a month     Drinks per session: 1 or 2     Binge frequency: Never    Drug use: Never        FAMILY HISTORY     Family History   Problem Relation Name Age of Onset    Hypertension Mother      Hypertension Sister       No history of colon cancer, ulcerative colitis, or Crohn's disease.   No history of inflammatory bowel disease.     REVIEW OF SYMPTOMS     Review of Systems  [facility] indicate positive for)  Constitutional: fatigue, night sweats, weight loss, weight gain, malaise, anorexia, fever.  Eyes: blurry vision, double vision, visual loss, red eyes, photophobia, eye pain.  Ears, Nose, Mouth, Throat: Negative for difficulty swallowing, hoarseness, dentures, nosebleeds, rhinorrhea, hearing loss, tinnitus, discharge from ears.  CV: palpitations, syncope, chest pain, paroxysmal nocturnal dyspnea, orthopnea, lower extremity edema, cyanosis, claudication.  Resp: Negative for cough, sputum, hemoptysis, shortness of breath, excessive snoring, apnea spells while sleeping, pleuritic pain, wheezing, dyspnea on exertion.  GI: vomiting, dysphagia, nausea, heartburn or reflux, early satiety, hematemesis, abdominal pain, excessive gas or bloating, change in caliber of stool, hemorrhoids, bleeding from rectum, melena, hematochezia, constipation, diarrhea, jaundice. Rectal pain.  GU: nocturia, dysuria, decreased force of stream, frequency, hesitancy, hematuria, retention, incontinence, lumps in testicles, sexual difficulties.  Musculoskeletal: joint swelling, joint pain, joint redness, back pain, muscle weakness, muscle pain, muscle cramping.  Integumentary: moles that have changed, dark lesions, rash, itching, bruising, lumps or bumps, hair loss.  Neuro: confusion, headaches, feel faint, seizures, vertigo, memory loss, paralysis/weakness, numbness or tingling, clumsiness, tremor, speech impairment.  Psych: anhedonia, excessive guilt, depressed mood, crying spells, insomnia, early AM awakening, anxiety,  impaired concentration, angry, suicidal ideation, marital problems, abusive relationship.  Endo: cold intolerance, heat intolerance, polyphagia, polydipsia, polyuria, hair loss, excessive hair growth.  Heme/Lymphatic: anemia, bleeding disorder, abnormal bleeding, abnormal bruising, swollen nodes.  Allergy/Immun: hay fever, itchy eyes, itchy nose, clear nasal secretions, rash.      PHYSICAL EXAM     Recent Vital Signs:  Temp: 36.7 C (98 F) (09/25 1057)  Temp src: Temporal (09/25 1057)  Pulse: 67 (09/25 1058)  BP: 132/81 (09/25 1058)  Resp: --  SpO2: 100 % (09/25 1058)  Height: --  Weight: 77.7 kg (171 lb 4.8 oz) (09/25 1057)    Physical Exam  Constitutional: Oriented to person, place, and time. Well-developed and well-nourished.   HEENT: Normocephalic and atraumatic. Nose normal. Conjunctivae and EOM are normal.   Neck: Normal range of motion. Neck supple. No tracheal deviation present.   Cardiovascular: Normal rate, regular rhythm and normal heart sounds.    Pulmonary/Chest: Effort normal and breath sounds normal. No respiratory distress. No wheezes.   Abdominal: Soft. No distension and no palpable mass. No tenderness. No rebound.   Chaperoned by Elana Alm  Rectal: Right posterior thrombosed external hemorrhoid.  Musculoskeletal: Normal range of motion. No edema or deformity.   Neurological: alert and oriented to person, place, and time. No cranial nerve deficit.   Skin: Skin is warm. No rash noted. No pallor.   Psychiatric: Normal mood and affect. Behavior is normal. Judgment and thought content normal.      RECENT IMAGING     No recent imaging.     RECENT LABS     White Blood Cell Count   Date Value Ref Range Status   03/01/2018 6.6 4.5 - 11.0 K/MM3 Final     Hemoglobin   Date Value Ref Range Status   03/01/2018 12.6 12.0 - 16.0 g/dL Final     Platelet Count   Date Value Ref Range Status   03/01/2018 334 130 - 400 K/MM3 Final     Sodium   Date Value Ref Range Status   03/01/2018 138 135 - 145 mmol/L Final      Potassium   Date Value Ref Range Status   03/01/2018 4.2 3.3 - 5.0 mmol/L Final     Creatinine Serum   Date Value Ref Range Status   03/01/2018 0.99 0.44 - 1.27 mg/dL Final     Albumin   Date Value Ref Range Status   03/01/2018 3.9 3.4 - 4.8 g/dL Final        ASSESSMENT AND PLAN     Kayla Morris is a 66yr female with thrombosed external hemorrhoid which at this point since it is getting better, will manage conservatively.  Educated on the nature of hemorrhoids. Discussed conservative measures. Will need to exam the extent of the hemorrhoids following healing of the thrombosed  hemorrhoid for possible surgical intervention.    - Conservative Measures  - Take fiber supplements (Benefiber or Metamucil), start with 1-2 tbsp/day  - Drink at least 64 oz of water daily.  - Diet: Increase fiber intake.  - Take Miralax if constipated everyday. Hold for loose stools.  - Avoid/reduce straining, distractions, and time spent on the toilet bowl to less than 2 minutes.  - Avoid excessive wiping. Can use alcohol-free baby wipes.  - Perianal hygiene: Do not use soap, just use water and sitz baths.  - Dry the perianal area, then apply Vaseline, Calmoseptine, or Desitin.  - Exercise.  - If symptoms continue to persist, possible hemorrhoidectomy.    - RTC 5 weeks      SCRIBE DISCLAIMER:  This note was scribed by Warren Lacy, SCRIBE, a trained medical scribe, in the presence of Wissam Baldomero Lamy, MD. Electronically signed by Warren Lacy, SCRIBE, Scribe  06/22/2019  11:14 AM    **WHDISCLAIMER**     I saw and evaluated the patient and went over the HPI, past history, reviewed the medications as documented in the note. Please see the note for details.   I personally performed the services described in this documentation as scribed by Warren Lacy in my presence, and it is both accurate and complete.     Wissam Baldomero Lamy MD   Assistant Professor   Division of Colon and Rectal Surgery  Department of Surgery  [facility]

## 2019-06-23 ENCOUNTER — Encounter: Payer: Self-pay | Admitting: Surgery

## 2019-06-23 ENCOUNTER — Ambulatory Visit: Payer: 59 | Attending: Surgery | Admitting: Surgery

## 2019-06-23 VITALS — BP 132/81 | HR 67 | Temp 98.0°F | Wt 171.3 lb

## 2019-06-23 DIAGNOSIS — K645 Perianal venous thrombosis: Secondary | ICD-10-CM

## 2019-06-23 NOTE — Nursing Note (Signed)
Blood pressure 132/81, pulse 67, temperature 36.7 C (98 F), temperature source Temporal, weight 77.7 kg (171 lb 4.8 oz), SpO2 100 %.    Patient WAS wearing a surgical mask  Contact precautions were followed when caring for the patient.   PPE used by provider during encounter: Surgical mask and Face Shield/Goggles    Patient's name and DOB verified. Vital signs taken, screened for pain, allergies and pharmacy verified.     Cleda Daub, [place]

## 2019-06-23 NOTE — Patient Instructions (Signed)
Lifestyle Changes  1. Take daily fiber supplements: Benefiber (wheat dextrin) or Metamucil (psyllium husk) 1-2 tbsp/day up to 5 tbsp/day.  2. Recommended amount is to get at least 30 grams of fiber daily (35 grams of fiber daily for men).  3. Drink at least 64 oz of water or more daily, approximately equal to 8 cups. Caffeinated liquids do not count and work against you. For example, if you drink one cup of water (+1) and one cup of coffee (-1), your net hydration is 0. It is recommended to reach at least 8.  4. Increase daily dietary fiber intake. Beans are a great source. A good reference would be to search "high fiber diet" on Google.  5. Exercise. This helps prevent constipation.  6. For constipation, take Miralax. You can balance by taking your fiber supplement in the morning and Miralax once at night.  7. Minimize time spent on the toilet (less than 2 minutes) and avoid straining as much as possible. Avoid distractions, such as reading a book or the newspaper, playing on your phone, and browsing social media. Avoid excessive wiping.  8. For perianal hygiene, do not use soap in the area. Only use water. You can also soak in sitz baths (warm water) or use baby wipes (alcohol free).  9. Dry the perianal area. You can then apply a barrier cream, such as Vaseline, Calmoseptine, or Desitin.

## 2019-06-28 ENCOUNTER — Other Ambulatory Visit: Payer: Self-pay | Admitting: OBSTETRICS/GYN

## 2019-06-28 DIAGNOSIS — Z3169 Encounter for other general counseling and advice on procreation: Secondary | ICD-10-CM

## 2019-06-28 DIAGNOSIS — D250 Submucous leiomyoma of uterus: Secondary | ICD-10-CM

## 2019-06-28 DIAGNOSIS — N840 Polyp of corpus uteri: Secondary | ICD-10-CM

## 2019-06-28 NOTE — Progress Notes (Signed)
[place]  [place]  PU:[id number]  Phone: [phone]  Fax: [phone]    Visit Note    Provider: Gareth Eagle, MD, Ph.D.  Encounter Date: May 05, 2019  Procedure: Bess Kinds  Patient: Kayla Morris, Kayla Morris   [id number])  Sex: Female  DOB: November 24, 1980     Age: 38 Year 9 Month 1 Week   Address: [address], [place] [address]     Pref. Phone(C): [phone]    Insurance(s):   [employer] VAPC3 (PP)      Referred By:  Jerelyn Scott    Current Medication:  Other MD:   1 Amlodipine 5mg     5 mg per day.    Current Visit:  Clinical History: G0, currently single, seeking donor sperm treatment options. She has no previous history of fertility evaluations and/or treatments, although, she reports history of trying to have a conception in her previous relationship for approximately 3-4 years. She also reports a possible history of small uterine fibroid or fibroids. She has past history of LEEP, her last Pap smear was in 09/2015, reportedly normal. She has remote history of Chlamydia as well. Her family history is significant for ovarian cancer in her maternal grandmother and otherwise unremarkable. 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 She is interested in pursuing further fertility evaluations and is open to fertility treatment options.    Female labs: Need  Carrier screen: Need   SCUS: Today (05/05/19)   Reason for Visit:  Saline Contrast Ultrasound, CD:8 *Please ask pt if she wants to do [employer] carrier screen or have her sign the waiver in chart.    Prescription:  1 Levora-28 Tablet 0.15-0.03 Mg  SIG: Take once PO q day. Skip inactive pills, and continue with a new pack, if needed DO NOT STOP UNTIL INSTRUCTED TO  QTY: 1.00 REF: 6    Diagnosis:  Z31.41  Encounter for fertility testing     Procedures:  SALINE CONTRAST ULTRASOUND: The procedure was discussed with the patient in detail and informed consent was obtained.  The patient was positioned in the dorsal lithotomy position. A speculum was inserted into the vagina for visualization of the cervix. Cotton swabs were used to clean around the cervix. An HSG or sonohysterography catheter was flushed with saline before placement through the cervix and into the uterus. The balloon was inflated just enough to seal the catheter in place. The speculum was removed and the transvaginal ultrasound probe was inserted. An comprehensive inspection of the pelvis was performed to evaluate the uterus, adnexa, cul-de-sac, and ovary/ovaries. Saline was then injected slowly through the catheter to provide intrauterine contrast. The lining and contour of the endometrium was evaluated by ultrasound. All instruments were removed.    Endometrium Thickness: 8.52 mm.    Uterus.  Length: 39mm Height: 52mm Width: 52mm   Fibroids   30 x 39 x 45mm intramural Fibroid  18 x 21 mm submucous Fibroid type 0-1  19 x 15 mm submucous Fibroid, type 0-1   13 x 10 mm Polyp   Right Ovary: : 25 x 14 x 15 mm Ovary  7<5 mm   Left Ovary: : 34 x 14 x 15 mm Ovary  4<5 mm.        Assessment:  Uterus is anteverted and mildly bulky, posterior mid myometrium has a fibroid. Bilaterally adnexae/ovaries are unremarkable, without cysts or masses, There are  no dominant follicles or cysts in either adnexa. there is no significant free fluid noted in the pelvis.     On saline infusion, the uterine cavity is noted to have three separate fibroids, at least two are type 0-type 1; however the third may be connected to an intramural fibroid or fibroid component.    Imp: Uterine fibroids and fibroid polyps, AFC is about 9-10.    Discussion:  Discussed findings and options. Discussed about proceeding with IVF first and then addressing the fibroids subsequently before planning the FET.    In view of possible intramural portion/s, we discussed about a possibility of an incomplete removal of fibroids requiring additional procedure or procedures.  All questions were answered.    Plan:  -Okay to proceed with IVF per patient request  -Hysteroscopy and polypectomy is okay to schedule after TVOR is completed. (will need morcellator and possibly 8 mm hysteroscope and matching fibroid blade, may need re-evaluation, if proceeding for hysteroscopy after 3 months or later)  -Okay to start OC pills today.

## 2019-08-02 NOTE — Progress Notes (Signed)
Colon and Rectal Surgery Clinic Note - Follow Up   Attending: Dr. [name] [id number], [phone])   Date of Appointment: 08/04/2019        CHIEF COMPLAINT / REASON FOR APPOINTMENT     External hemorrhoid    SUBJECTIVE     Kayla Morris is a 47yr female presenting to the Colon and Rectal Surgery clinic today for follow up for thrombosed external hemorrhoid. She was last seen at [facility] on 06/23/19 by Dr. [name]. Plan at the last clinic appointment was to manage conservatively. Since her last appointment, she has not had any hospitalizations or emergency room visits.    Since the last visit, the patient is doing well overall. Patient states that she is feeling way better than before. Denies bloody stool or rectal pain. No other complaint or novel symptoms at this time.     COLORECTAL SURGICAL HISTORY     None.     PAST MEDICAL HISTORY     Active Ambulatory Problems     Diagnosis Date Noted    Asthma 03/27/2019     Resolved Ambulatory Problems     Diagnosis Date Noted    No Resolved Ambulatory Problems     Past Medical History:   Diagnosis Date    Hypertension         PAST SURGICAL HISTORY     Past Surgical History:   Procedure Laterality Date    NO SURGICAL HISTORY          MEDICATIONS     Current Outpatient Medications on File Prior to Visit   Medication    Amlodipine (NORVASC) 5 mg Tablet    Hydrocortisone (ANUSOL-HC) 25 mg Suppository    PNV [id number] fum/folic ac (PRENATAL PO)    Triamcinolone (KENALOG) 0.1 % Cream     No current facility-administered medications on file prior to visit.         ALLERGIES     No Known Allergies     SOCIAL HISTORY     Social History     Tobacco Use    Smoking status: Never Smoker    Smokeless tobacco: Never Used   Substance Use Topics    Alcohol use: Yes     Frequency: 2-4 times a month     Drinks per session: 1 or 2     Binge frequency: Never    Drug use: Never        FAMILY HISTORY     Family History   Problem Relation Name Age of Onset    Hypertension Mother       Hypertension Sister          REVIEW OF SYMPTOMS     Review of Systems  CONST: denies fever, chills.  NEURO: denies loss of sensation, paresthesias.  PSYCH: denies hallucinations, delusions. Denies depression, anxiety.   SKIN: denies rashes or lesions.  GI: denies nausea, emesis. Denies diarrhea. Denies constipation.   URO: denies dysuria. Denies fecaluria and pneumaturia.   CV: denies chest pain, irregular heartbeats.  PULM: denies shortness of breath. Denies wheezing.   MUSC: denies loss of strength. Denies difficulty with upper and lower extremity use.   ENDO: denies hot/cold flashes. Denies lethargy. Denies excessive fatigue.       PHYSICAL EXAM     Recent Vital Signs:  Temp: 36.2 C (97.1 F) (11/06 1056)  Temp src: Temporal (11/06 1056)  Pulse: 91 (11/06 1057)  BP: 134/85 (11/06 1057)  Resp: --  SpO2: 100 % (11/06  1057)  Height: --  Weight: 77.5 kg (170 lb 13.7 oz) (11/06 1056)    Physical Exam  Constitutional: Oriented to person, place, and time. Well-developed and well-nourished.   HEENT: Normocephalic and atraumatic. Nose normal. Conjunctivae and EOM are normal.   Neck: Normal range of motion. Neck supple. No tracheal deviation present.   Cardiovascular: Normal rate, regular rhythm and normal heart sounds.    Pulmonary/Chest: Effort normal and breath sounds normal. No respiratory distress. No wheezes.   Abdominal: Soft. No distension and no palpable mass. No tenderness. No rebound.   Rectal: Hemorrhoidal skin tag in the right posterior location. DRE: Elevated sphincter tone. No palpable masses. Anoscopy: Grade I internal hemorrhoid on the left. Grade II internal hemorrhoid on the right side.  Musculoskeletal: Normal range of motion. No edema or deformity.   Neurological: alert and oriented to person, place, and time. No cranial nerve deficit.   Skin: Skin is warm. No rash noted. No pallor.   Psychiatric: Normal mood and affect. Behavior is normal. Judgment and thought content normal.      RECENT IMAGING      No recent imaging.     RECENT LABS     White Blood Cell Count   Date Value Ref Range Status   03/01/2018 6.6 4.5 - 11.0 K/MM3 Final     Hemoglobin   Date Value Ref Range Status   03/01/2018 12.6 12.0 - 16.0 g/dL Final     Platelet Count   Date Value Ref Range Status   03/01/2018 334 130 - 400 K/MM3 Final     Sodium   Date Value Ref Range Status   03/01/2018 138 135 - 145 mmol/L Final     Potassium   Date Value Ref Range Status   03/01/2018 4.2 3.3 - 5.0 mmol/L Final     Creatinine Serum   Date Value Ref Range Status   03/01/2018 0.99 0.44 - 1.27 mg/dL Final     Albumin   Date Value Ref Range Status   03/01/2018 3.9 3.4 - 4.8 g/dL Final        ASSESSMENT AND PLAN     Kayla Morris is a 27yr female with resolving thrombosed external hemorrhoid and internal hemorrhoids. Patient is stable and doing well overall. Patient will continue with medical management.  She will call when she is ready to proceed with surgery.     - Continue conservative measures   - Continue fiber supplements   - Well hydration  - If symptoms persist, patient will reach out to clinic  - Possible hemorrhoidal skin tag removal  - RTC PRN     SCRIBE DISCLAIMER:  This note was scribed by Warren Lacy, SCRIBE, a trained medical scribe, in the presence of Wissam Baldomero Lamy, MD. Electronically signed by Warren Lacy, [place], Scribe  08/02/2019  11:27 AM     **WHDISCLAIMER**     I saw and evaluated the patient and went over the HPI, past history, reviewed the medications as documented in the note. Please see the note for details.   I personally performed the services described in this documentation as scribed by Warren Lacy in my presence, and it is both accurate and complete.     Wissam Baldomero Lamy MD   Assistant Professor   Division of Colon and Rectal Surgery  Department of Surgery  [place] [facility]

## 2019-08-04 ENCOUNTER — Ambulatory Visit: Payer: 59 | Attending: Surgery | Admitting: Surgery

## 2019-08-04 ENCOUNTER — Encounter: Payer: Self-pay | Admitting: Surgery

## 2019-08-04 VITALS — BP 134/85 | HR 91 | Temp 97.1°F | Wt 170.9 lb

## 2019-08-04 DIAGNOSIS — K645 Perianal venous thrombosis: Secondary | ICD-10-CM

## 2019-08-04 DIAGNOSIS — K640 First degree hemorrhoids: Secondary | ICD-10-CM | POA: Insufficient documentation

## 2019-08-04 DIAGNOSIS — K648 Other hemorrhoids: Secondary | ICD-10-CM

## 2019-08-04 DIAGNOSIS — K641 Second degree hemorrhoids: Secondary | ICD-10-CM | POA: Insufficient documentation

## 2019-08-04 NOTE — Procedures (Signed)
After risks and benefits were explained to [facility] including but not limited to bleeding, pain and missed lesions. All questions answered. Consent was attained, after timeout was done.     Patient was placed in the left lateral position.   Anoscopy was placed and advanced under direct vision:  See PE for rectal exam details.    Zero blood loss    SCRIBE DISCLAIMER:  This note was scribed by Warren Lacy, SCRIBE, a trained medical scribe, in the presence of Wissam Baldomero Lamy, MD. Electronically signed by Warren Lacy, SCRIBE, Scribe  08/04/2019  11:25     **[place]**     I personally performed the services described in this documentation as scribed by Warren Lacy in my presence, and it is both accurate and complete.     Wissam Baldomero Lamy MD   Assistant Professor   Division of Colon and Rectal Surgery  Department of Surgery  [place] [facility]

## 2019-08-04 NOTE — Nursing Note (Signed)
Patient WAS wearing a surgical mask  Contact precautions were followed when caring for the patient.   PPE used by provider during encounter: Surgical mask and Face Shield/Goggles    Blood pressure 134/85, pulse 91, temperature 36.2 C (97.1 F), temperature source Temporal, weight 77.5 kg (170 lb 13.7 oz), SpO2 100 %.    Patient's name and DOB verified. Vital signs taken, screened for pain, allergies and pharmacy verified.     Kayla Morris, [place]

## 2019-08-08 ENCOUNTER — Telehealth: Payer: Self-pay | Admitting: Surgery

## 2019-08-08 NOTE — Telephone Encounter (Signed)
Caller Name: Annyssa    Reason for call: schedule surgery     Call back number: [phone] (home)      Message to relay:     Patient requesting surgery date with  Halabi. Please contact her back.       [facility]  [phone]

## 2019-08-15 ENCOUNTER — Telehealth: Payer: Self-pay | Admitting: Surgery

## 2019-08-15 NOTE — Telephone Encounter (Signed)
Called left VM asking for return call to sched procedure

## 2019-09-05 ENCOUNTER — Encounter: Payer: Self-pay | Admitting: Surgery

## 2019-09-05 DIAGNOSIS — R7303 Prediabetes: Secondary | ICD-10-CM | POA: Insufficient documentation

## 2019-09-05 DIAGNOSIS — M545 Low back pain, unspecified: Secondary | ICD-10-CM | POA: Insufficient documentation

## 2019-09-05 DIAGNOSIS — I10 Essential (primary) hypertension: Secondary | ICD-10-CM | POA: Insufficient documentation

## 2019-09-05 DIAGNOSIS — D509 Iron deficiency anemia, unspecified: Secondary | ICD-10-CM | POA: Insufficient documentation

## 2019-09-05 NOTE — Pre-Op/Pre-Procedure Screening (Signed)
Patient Name: Kayla Morris  83yr  05/09/1981    Scheduled Surgery Date: 09/12/2019  Proposed Surgery: Procedure(s):  HEMORRHOIDECTOMY (N/A)  Pre-op Dx: Thrombosed external hemorrhoid [K64.5]  Internal hemorrhoids [K64.8]  Surgeon: Surgeon(s):  Halabi, [name], MD    Chart update  (Medication reconciliation/preop instruction +/- Diabetic assessment, STOPBANG not done)    Patient Active Problem List    Diagnosis Date Noted    Hypertension 09/05/2019    Iron deficiency anemia 09/05/2019    Low back pain 09/05/2019    Prediabetes 09/05/2019    Asthma 03/27/2019       PMH:  Past Medical History:   Diagnosis Date    Asthma     Back pain     Hemorrhoid     Hypertension     Submandibular lymphadenopathy LEFT     6/2019US head neck (3.3cm left submandibular salivary swelling vs adenoapthy - reactive ?        PSH:  Past Surgical History:   Procedure Laterality Date    NO SURGICAL HISTORY            Allergies: No Known Allergies    Medication   No current facility-administered medications on file prior to encounter.      Current Outpatient Medications on File Prior to Encounter   Medication Sig Dispense Refill    Amlodipine (NORVASC) 5 mg Tablet Take 1 tablet by mouth every day. 90 tablet 3    Hydrocortisone (ANUSOL-HC) 25 mg Suppository Insert 1 suppository into the rectum every 12 hours. 12 suppository 0    PNV [id number] fum/folic ac (PRENATAL PO)       Triamcinolone (KENALOG) 0.1 % Cream Apply to the affected area 2 times daily. (rash) 30 g 1                  Drug/Alcohol Hx:  Social History     Tobacco Use   Smoking Status Never Smoker   Smokeless Tobacco Never Used         Social History     Substance and Sexual Activity   Alcohol Use Yes    Frequency: 2-4 times a month    Drinks per session: 1 or 2    Binge frequency: Never     Social History     Substance and Sexual Activity   Drug Use Never          03/09/2019 holter   Impression    Hook up date: 03/09/19 @ 1:08 PM   1. Scan date: 03/15/19    2. Analyzed 47:59 hours.   3. Holter study demonstrated sinus rhythm with the heart rate range of 48 bpm to 152 bpm and the   average heart rate was 84 bpm. There were no pauses seen.   4. There were 5 isolated PACs (0.0%).   5. There were 2 isolated PVCs and one ventricular couplet (0.0%).   6. There was no patient diary and no patient activated events were recorded.   7. The monitor quality was fair/poor due to excessive motion artifacts.   CONCLUSION: Impression:   CONCLUSION: 1. Rhythm is sinus. No pauses. Average HR 84bpm.   CONCLUSION: 2. Rare PACs.   CONCLUSION: 3. Rare PVCs.   CONCLUSION: 4. No symptoms reported and no monitor activations.     US SOFT TISSUE HEAD / NECK / SCALP (NON-THYROID)  EXAM DATE: 02/28/2018 3:18 PM  COMPARISON: None    INDICATION: Signs/Symptoms:  Left submandibular salivary swelling vs inflamed node  TECHNIQUE: Real-time ultrasound scanning of the left submandibular region  was performed by the sonographer. Representative static images and video  clips were submitted for review.    FINDINGS:  Multiple lymph nodes in the submandibular region account for the palpable  abnormality. The largest node is 3.3 cm in long axis and has normal  reniform morphology but thickened cortex and lobulated contour. Some of the  other nodes have more rounded morphology.    IMPRESSION:  1. Submandibular lymphadenopathy, which could be reactive or malignant.  Correlate for signs of infection or malignancy in the mouth and pharynx.  Consider fine needle aspiration and biopsy of the lymph nodes if there is  suspicion of cancer or if they do not regress with conservative treatment.    Labs:   CBC:   Lab Results   Component Value Date/Time    White Blood Cell Count 6.6 03/01/2018 1208    Red Blood Cell Count 4.62 03/01/2018 1208    Hemoglobin 12.6 03/01/2018 1208    Hematocrit 37.7 03/01/2018 1208    MCV 81.6 03/01/2018 1208    MCH 27.3 03/01/2018 1208    RDW 15.8 (H) 03/01/2018 1208    MPV 8.3  03/01/2018 1208    Platelet Count 334 03/01/2018 1208       BMP/CMP:  Lab Results   Component Value Date/Time    Sodium 138 03/01/2018 1208    Potassium 4.2 03/01/2018 1208    Chloride 103 03/01/2018 1208    Carbon Dioxide Total 26 03/01/2018 1208    Creatinine Serum 0.99 03/01/2018 1208    E-GFR, African American (Female) >60 03/01/2018 1208    E-GFR, Non-African American (Female) >60 03/01/2018 1208    Urea Nitrogen, Blood (BUN) 13 03/01/2018 1208    Glucose 84 03/01/2018 1208    Calcium 9.5 03/01/2018 1208     Lab Results   Component Value Date/Time    Protein 7.7 03/01/2018 1208    Albumin 3.9 03/01/2018 1208    Alkaline Phosphatase (ALP) 74 03/01/2018 1208    Aspartate Transaminase (AST) 17 03/01/2018 1208    Bilirubin Total 0.6 03/01/2018 1208    Alanine Transferase (ALT) 20 03/01/2018 1208       HgBA1C:   No results found for: HGBA1C    Coag Panel:  No results found for: APTT, PT, INR    Thyroid Panel:  Lab Results   Component Value Date/Time    Thyroid Stimulating Hormone 1.83 03/01/2018 1208          Vitals:  Temp: 36.2 C (97.1 F) (08/04/2019 10:56 AM)  Temp src: Temporal (08/04/2019 10:56 AM)  Pulse: 91 (08/04/2019 10:57 AM)  BP: 134/85 (08/04/2019 10:57 AM)  SpO2: 100 % (08/04/2019 10:57 AM)    Estimated body mass index is 25.98 kg/m as calculated from the following:    Height as of 03/27/19: 1.727 m (5\' 8" ).    Weight as of 08/04/19: 77.5 kg (170 lb 13.7 oz).        No LMP recorded. (Menstrual status: Other).       Dion Body, MD

## 2019-09-08 ENCOUNTER — Ambulatory Visit: Payer: 59 | Admitting: Family Medicine

## 2019-09-08 ENCOUNTER — Ambulatory Visit: Payer: 59 | Attending: Family Medicine

## 2019-09-08 ENCOUNTER — Telehealth: Payer: Self-pay | Admitting: Family Medicine

## 2019-09-08 ENCOUNTER — Encounter: Payer: Self-pay | Admitting: Family Medicine

## 2019-09-08 DIAGNOSIS — Z01812 Encounter for preprocedural laboratory examination: Secondary | ICD-10-CM

## 2019-09-08 DIAGNOSIS — R252 Cramp and spasm: Secondary | ICD-10-CM

## 2019-09-08 DIAGNOSIS — M546 Pain in thoracic spine: Secondary | ICD-10-CM

## 2019-09-08 DIAGNOSIS — M542 Cervicalgia: Secondary | ICD-10-CM

## 2019-09-08 LAB — COVID-2019 RNA, QUAL: SARS-CoV-2: NOT DETECTED

## 2019-09-08 MED ORDER — CYCLOBENZAPRINE 10 MG TABLET
10.0000 mg | ORAL_TABLET | Freq: Three times a day (TID) | ORAL | 0 refills | Status: AC | PRN
Start: 2019-09-08 — End: 2020-03-06

## 2019-09-08 MED ORDER — NAPROXEN 500 MG TABLET
500.0000 mg | ORAL_TABLET | Freq: Two times a day (BID) | ORAL | 0 refills | Status: DC | PRN
Start: 2019-09-08 — End: 2019-10-09

## 2019-09-08 NOTE — Nursing Note (Signed)
Vital signs taken, allergies verified, screened for pain, tobacco hx verified.  I have asked the patient if they have received any medical treatment or consultation outside of the [facility].  The patient has indicated no to receiving consults or services outside the  Health system.  If yes, we have requested information to ascertain these results.     Patient WAS wearing a surgical mask  Contact precautions were followed when caring for the patient.   PPE used by provider during encounter: Surgical mask and face shield.    Demaryius Imran MA 1
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
## 2019-09-08 NOTE — Nursing Note (Signed)
The following procedures were done per physician's order: Patient was swabbed for COVID 19 testing.     Patient WAS wearing a surgical mask  Droplet precautions were followed when caring for the patient.   PPE used by provider during encounter: Face Shield/Goggles, Procedure Mask, Gown, Gloves and PAPR    Swabbed both nostrils. Swabbed by T. Anthem Frazer, LVN.  Patient tolerated well.    Willia Lampert, LVN

## 2019-09-08 NOTE — Patient Instructions (Signed)
Nighttime Leg Cramps: Care Instructions  Your Care Instructions    Nighttime leg cramps happen when a leg muscle tightens up suddenly. This most often happens in the calf. But cramps in the thigh or foot are also common. Cramps often occur just as you fall asleep or wake up.  Leg cramps can be painful. They can last a few seconds to a few minutes. Though they are common, experts don't know exactly what causes them.  To treat muscle cramps, you can stretch and massage the muscle. If cramps keep coming back, your doctor may prescribe medicine that relaxes your muscles.  Follow-up care is a key part of your treatment and safety. Be sure to make and go to all appointments, and call your doctor if you are having problems. It's also a good idea to know your test results and keep a list of the medicines you take.  How can you care for yourself at home?   To stop a leg cramp, sit down and straighten your leg as you bend your foot up toward your knee. It may help to place a rolled towel under the ball of your foot and, while you hold the towel at both ends, gently pull the towel toward you while you keep your knee straight. This stretches the calf muscles. The cramp usually goes away after a few minutes.   Take a warm shower or bath to relax the muscle. Some people find that a heating pad placed on the muscle can also help. Others get relief by rubbing the calf with an ice pack.   Stretch your muscles every day, especially before and after exercise and at bedtime. Regular stretching can relax your muscles and may prevent cramps.   Do not suddenly increase the amount of exercise you get. Increase your exercise a little each week.   If your doctor prescribes medicine, take it exactly as prescribed. Call your doctor if you think you are having a problem with your medicine.   Ask your doctor if you can take an over-the-counter pain medicine, such as acetaminophen (Tylenol), ibuprofen (Advil, Motrin), or naproxen  (Aleve). Be safe with medicines. Read and follow all instructions on the label.   Drink plenty of fluids. If you have kidney, heart, or liver disease and have to limit fluids, talk with your doctor before you increase the amount of fluids you drink.  When should you call for help?  Watch closely for changes in your health, and be sure to contact your doctor if:  ?  You often have muscle cramps that do not go away after home treatment.   ?  Your muscle cramps often wake you up at night.   ?  You do not get better as expected.   Where can you learn more?  Go to https://www.healthwise.net/patientEd.  Enter S910 in the search box to learn more about "Nighttime Leg Cramps: Care Instructions."  Current as of: July 12, 2015  Content Version: 11.4   2006-2017 [employer]. Care instructions adapted under license by your healthcare professional. If you have questions about a medical condition or this instruction, always ask your healthcare professional. [employer] Inc[employer] disclaims any warranty or liability for your use of this information.
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
## 2019-09-11 NOTE — Anesthesia Preprocedure Evaluation (Addendum)
Anesthesia Evaluation    History of Present Illness  Pre-OR COVID testing:    09/08/2019 SARS-CoV-2 testing:  Not detected    38 y/o female presents for hemorrhoidectomy due to thrombosed external hemorrhoid.     PMH: IDA, HTN, back pain, pre-DM, asthma.     POC urine pregnancy test in pre-op today:  negative  Negative anesthesia history    Airway   Mallampati: III  TM distance: >3 FB     Neck ROM is full.     Dental    Teeth problems: crowns upper back.       Pulmonary   (+) asthma,     Patient's breath sounds clear to auscultation. Cardiovascular - negative for cardiac conditions with ROS  (+) hypertension (well controlled),      Rhythm: regular  Rate: normal  Patient has good exercise tolerance.   Neuro/Psych - negative neuro/psych ROS    GI/Hepatic/Renal - negative for GI hepatic or renal conditons with ROS        Abdominal   (-) obese   Endo/Other - negative for endocrine conditions with ROS    (-) diabetes mellitus (Pre-DM)  Smoking History  No history of smoking                          Anesthesia Plan    ASA 3     MAC   (MAC Anesthesia planned, G/A as back-up.  Pt consents to this.    ANESTHESIA   ATTENDING   NOTE  09/12/2019 09:15    This patient was seen, evaluated, and care plan was developed with the CRNA/resident  on 09/12/2019 PRIOR TO INDUCTION.     I have reviewed and examined the patient prior to induction of anesthesia and confirmed the issues in the pre-op screening note. There have been no interval changes.  I agree with the assessment and plan as outlined in the CRNA's note.      Report Electronically signed by:   Aldean Jewett, MD   Attending Anesthesiologist, Anesthesiology and Pain Medicine,  [phone],   Pager: [phone]     )  Intravenous induction    Anesthetic plan and risks discussed with patient.  Resident/Fellow/CRNA discussed the plan with the attending.  I personally performed a physical assessment on this patient.

## 2019-09-12 ENCOUNTER — Encounter: Admission: RE | Payer: Self-pay | Source: Ambulatory Visit | Attending: Surgery

## 2019-09-12 ENCOUNTER — Ambulatory Visit (HOSPITAL_BASED_OUTPATIENT_CLINIC_OR_DEPARTMENT_OTHER): Payer: 59 | Admitting: Certified Registered"

## 2019-09-12 ENCOUNTER — Encounter: Payer: Self-pay | Admitting: Surgery

## 2019-09-12 ENCOUNTER — Telehealth: Payer: Self-pay | Admitting: Surgery

## 2019-09-12 ENCOUNTER — Ambulatory Visit: Payer: 59 | Admitting: Certified Registered"

## 2019-09-12 ENCOUNTER — Ambulatory Visit
Admission: RE | Admit: 2019-09-12 | Discharge: 2019-09-12 | Disposition: A | Discharge status: Home / Self Care | Payer: 59 | Attending: Surgery | Admitting: Surgery

## 2019-09-12 DIAGNOSIS — K645 Perianal venous thrombosis: Secondary | ICD-10-CM

## 2019-09-12 DIAGNOSIS — K649 Unspecified hemorrhoids: Secondary | ICD-10-CM

## 2019-09-12 DIAGNOSIS — I10 Essential (primary) hypertension: Secondary | ICD-10-CM | POA: Insufficient documentation

## 2019-09-12 DIAGNOSIS — K648 Other hemorrhoids: Secondary | ICD-10-CM | POA: Insufficient documentation

## 2019-09-12 DIAGNOSIS — K644 Residual hemorrhoidal skin tags: Secondary | ICD-10-CM

## 2019-09-12 HISTORY — PX: EXCISIONAL HEMORRHOIDECTOMY: SHX1541

## 2019-09-12 HISTORY — DX: Unspecified hemorrhoids: K64.9

## 2019-09-12 HISTORY — DX: Localized enlarged lymph nodes: R59.0

## 2019-09-12 HISTORY — DX: Unspecified asthma, uncomplicated: J45.909

## 2019-09-12 HISTORY — DX: Dorsalgia, unspecified: M54.9

## 2019-09-12 LAB — POC PREGNANCY SPOT URINE (UPT)
POC PREGNANCY LOT#: 42125
POC PREGNANCY: NEGATIVE

## 2019-09-12 SURGERY — HEMORRHOIDECTOMY
Anesthesia: Monitored Anesthesia Care | Site: Anus | Wound class: Contaminated

## 2019-09-12 MED ORDER — FENTANYL (PF) 50 MCG/ML INJECTION SOLUTION
25.0000 ug | INTRAMUSCULAR | Status: DC | PRN
Start: 2019-09-12 — End: 2019-09-12

## 2019-09-12 MED ORDER — INTRAOP LIDOCAINE PF 2% INJ 5 ML VIAL
Status: DC | PRN
Start: 2019-09-12 — End: 2019-09-12
  Administered 2019-09-12: 10:00:00 5 mL via INTRAVENOUS

## 2019-09-12 MED ORDER — LIDOCAINE HCL 10 MG/ML (1 %) INJECTION SOLUTION
0.1000 mL | INTRAMUSCULAR | Status: DC | PRN
Start: 2019-09-12 — End: 2019-09-12

## 2019-09-12 MED ORDER — LACTATED RINGERS IV INFUSION
INTRAVENOUS | Status: DC
Start: 2019-09-12 — End: 2019-09-12

## 2019-09-12 MED ORDER — INTRAOP PROPOFOL INJ 20 ML VIAL (BOLUS+INFUSION)
Status: DC | PRN
Start: 2019-09-12 — End: 2019-09-12
  Administered 2019-09-12 (×2): 30 mg via INTRAVENOUS

## 2019-09-12 MED ORDER — INTRAOP NACL 0.9% 500 ML IRRIGATION BOTTLE
Status: DC | PRN
Start: 2019-09-12 — End: 2019-09-12
  Administered 2019-09-12: 10:00:00 500 mL

## 2019-09-12 MED ORDER — CELLULOSE, OXIDIZED 2" X 14" PADS
MEDICATED_PAD | Status: DC | PRN
Start: 2019-09-12 — End: 2019-09-12
  Administered 2019-09-12: 10:00:00 1 via TOPICAL

## 2019-09-12 MED ORDER — LIDOCAINE 1 %-EPINEPHRINE 1:100,000 INJECTION SOLUTION
INTRAMUSCULAR | Status: DC | PRN
Start: 2019-09-12 — End: 2019-09-12

## 2019-09-12 MED ORDER — MIDAZOLAM (PF) 1 MG/ML INJECTION SOLUTION
INTRAMUSCULAR | Status: AC
Start: 2019-09-12 — End: 2019-09-12
  Filled 2019-09-12: qty 2

## 2019-09-12 MED ORDER — PROPOFOL 10 MG/ML INTRAVENOUS EMULSION
INTRAVENOUS | Status: AC
Start: 2019-09-12 — End: 2019-09-12
  Filled 2019-09-12: qty 600

## 2019-09-12 MED ORDER — HYDROMORPHONE 2 MG/ML INJECTION SOLUTION
INTRAMUSCULAR | Status: AC
Start: 2019-09-12 — End: 2019-09-12
  Filled 2019-09-12: qty 1

## 2019-09-12 MED ORDER — INTRAOP BUPIVACAINE PF 0.25% INJ 30 ML VIAL ADDITIVE
Status: DC | PRN
Start: 2019-09-12 — End: 2019-09-12
  Administered 2019-09-12: 10:00:00 20 mL

## 2019-09-12 MED ORDER — OXYCODONE 5 MG TABLET
5.0000 mg | ORAL_TABLET | ORAL | 0 refills | Status: AC | PRN
Start: 2019-09-12 — End: 2019-09-27

## 2019-09-12 MED ORDER — NALOXONE 4 MG/ACTUATION NASAL SPRAY
NASAL | 0 refills | Status: AC
Start: 2019-09-12 — End: 2020-09-11

## 2019-09-12 MED ORDER — INTRAOP PROPOFOL INJ 100 ML VIAL (BOLUS+INFUSION)
Status: DC | PRN
Start: 2019-09-12 — End: 2019-09-12
  Administered 2019-09-12: 10:00:00 225 ug/kg/min via INTRAVENOUS

## 2019-09-12 MED ORDER — LACTATED RINGERS IV INFUSION
INTRAVENOUS | Status: DC
Start: 2019-09-12 — End: 2019-09-12
  Administered 2019-09-12: 09:00:00 via INTRAVENOUS

## 2019-09-12 MED ORDER — HYDROMORPHONE 2 MG/ML INJECTION SOLUTION
INTRAMUSCULAR | Status: DC | PRN
Start: 2019-09-12 — End: 2019-09-12
  Administered 2019-09-12 (×2): 1 mg via INTRAVENOUS

## 2019-09-12 MED ORDER — MIDAZOLAM (PF) 1 MG/ML INJECTION SOLUTION
INTRAMUSCULAR | Status: DC | PRN
Start: 2019-09-12 — End: 2019-09-12
  Administered 2019-09-12: 10:00:00 2 mg via INTRAVENOUS

## 2019-09-12 MED ORDER — CHLORHEXIDINE GLUCONATE 0.12 % MOUTHWASH
15.0000 mL | MOUTHWASH | Status: DC
Start: 2019-09-12 — End: 2019-09-12

## 2019-09-12 MED ORDER — ONDANSETRON HCL (PF) 4 MG/2 ML INJECTION SOLUTION
4.0000 mg | INTRAMUSCULAR | Status: AC | PRN
Start: 2019-09-12 — End: 2019-09-12
  Administered 2019-09-12: 12:00:00 4 mg via INTRAVENOUS
  Filled 2019-09-12: qty 2

## 2019-09-12 MED ORDER — LIDOCAINE 1 %-EPINEPHRINE 1:100,000 INJECTION SOLUTION
INTRAMUSCULAR | Status: DC | PRN
Start: 2019-09-12 — End: 2019-09-12
  Administered 2019-09-12: 10:00:00 20 mL

## 2019-09-12 MED ORDER — HYDROMORPHONE 1 MG/ML INJECTION SYRINGE
0.2000 mg | INJECTION | INTRAMUSCULAR | Status: DC | PRN
Start: 2019-09-12 — End: 2019-09-12

## 2019-09-12 SURGICAL SUPPLY — 24 items
CAUTERY BOVIE PAD ADULT (Other) ×2 IMPLANT
CAUTERY PENCIL WITH ROCKER SWITCH / SMOKE EVACUATOR AND ELECTRODE BLADE (Other) ×2 IMPLANT
CLOTH TOWEL OR 4PK STRLF BLUE (327267) (Drape) ×2 IMPLANT
CONTAINER MEDICINE CUP 2OZ STERILE (Other) ×4 IMPLANT
DISC OBS 101805 - COVER LIGHT HANDLE FLEXIBLE GREEN (Other) ×2 IMPLANT
DRAPE SHEET LAP PEDIATRIC (Drape) ×2 IMPLANT
DRESSING ABD (A7087) (Dressing) ×2 IMPLANT
DRESSING SWAB BENZOIN (Dressing) ×2 IMPLANT
GLOVES BIOGEL 7 1/2 TOP GLOVE LATEX (Glove) ×6 IMPLANT
GLOVES BIOGEL 7 TOP GLOVE LATEX (Glove) ×4 IMPLANT
GLOVES BIOGEL INDICATOR 7 GREEN UNDERGLOVE LATEX (Glove) ×2 IMPLANT
GLOVES PROTEXIS PI MICRO 8 2D73PM80 (Glove) ×4 IMPLANT
GOWN XX LARGE ULTRA FILM REINFORCED ARTHROSCOPY (Gown) ×2 IMPLANT
INSTRUMENT POOLE SUCTION TIP (Other) ×2 IMPLANT
LUBRICANT SURGILUBE 4OZ TUBE (Other) ×2 IMPLANT
NEEDLE 25 GAUGE X 1 1/2IN (Needle) ×2 IMPLANT
PACK BASIN LATEX SAFE (DYNJ0191310Q) (Pack) ×2 IMPLANT
PANTIES STRETCH DISP XLG - PERINEAL UNDERWEAR (353171) (Other) ×2 IMPLANT
PREP BETADINE PAINT 4OZ (Prep) ×2 IMPLANT
SPONGE GAUZE 4 X 4IN 12PLY STERILE 10 PACK (Dressing) ×2 IMPLANT
SUTURE VICRYL 2-0 SH 27IN UNDYED (Suture) ×2 IMPLANT
SUTURE VICRYL 3-0 SH 27IN UNDYED (Suture) ×2 IMPLANT
SYRINGE 12ML WITH LUER LOCK TIP STERILE PACK (Syringe) ×4 IMPLANT
TAPE SILK 2IN X 10YDS DURAPORE (Other) ×2 IMPLANT

## 2019-09-12 NOTE — Anesthesia Postprocedure Evaluation (Signed)
Patient: Kayla Morris    HEMORRHOIDECTOMY    Anesthesia Type: MAC    Stop Bang:      Vital Signs (Last Recorded):  BP: 107/49 (09/12/19 1030)  Pulse: 98 (09/12/19 1030)  Resp: 19 (09/12/19 1030)  Temp Max: 36.3 C (97.3 F)  (Last 24 hours)  Temp: 36.3 C (97.3 F) (09/12/19 1015)  SpO2: 100 % (09/12/19 1030) on    Device (Oxygen Therapy): high-flow nasal cannula (09/12/19 1030)      Anesthesia Post Evaluation    Procedure: Procedure(s):HEMORRHOIDECTOMY  Location: [facility] OR  Anesthesia: Monitored Anesthesia Care    Patient location during evaluation: PACU  Patient participation: complete - patient participated  Level of consciousness: awake and alert  Pain score: 0  Pain management: satisfactory to patient  Airway patency: patent  Anesthetic complications: no  Cardiovascular status: blood pressure returned to baseline  Respiratory status: room air, spontaneous ventilation and nonlabored ventilation  Hydration status: euvolemic  Nausea and Vomiting: absent    Comments:   09/12/2019  10:44    Dispo:       S/p MAC anesthesia.  PACU course uneventful.    No noted anesthetic problems.  All anesthesia questions answered.  Patient is without complaint and is stable for PACU discharge to home in the care of family/friend.    Report Electronically signed by:   Sharia Reeve, MD   Attending Anesthesiologist, Anesthesiology and Pain Medicine,  [phone],   Pager: [phone]      Aldean Jewett, MD

## 2019-09-12 NOTE — Op Note (Signed)
[facility]   Division of COLON AND RECTAL SURGERY  ATTENDING NOTE     Date of Service: 09/12/2019  Pre-Op Diagnosis:  Thrombosed external hemorrhoid [K64.5]  Internal hemorrhoids [K64.8]  Post-Op Diagnosis:  Post-Op Diagnosis Codes:     * Thrombosed external hemorrhoid [K64.5]     * Internal hemorrhoids [K64.8]  Procedure Performed/Description:  Procedure(s):  HEMORRHOIDECTOMY (N/A)   (2 columns)  Name of Surgeon and Assistants:  Surgeon(s) and Role:     * [name], [name], MD - Primary  Type of Anesthesia:  Monitored Anesthesia Care Monitored Anesthesia Care  ASA II  Emergency no       Indication for the Procedure:   Kayla Morris is a 38yr old female with mixed internal and external hemorrhoids. We discussed the risks and benefits of her surgery including but not limited to bleeding, infection, post-procedure pain, recurrence, change in anal sensation, potential significant post-op swelling, residual hemorrhoidal skin tags, remote chance of incontinence and stenosis, staging, recurrence, and nonsuccess.  All questions were answered, patient understands, consent was signed.       PROCEDURE:       The patient was taken to the Operating Room, Time Out was performed prior to surgery x 2, as she wasidentified as  Kayla Morris  and the procedure verified. Antibiotic prophylaxis is not indicated in this case.     Monitored Anesthesia Care  Anesthesia was induced. Patient  was positioned in  Prone - Flexed at Hip position with padding as necessary on pressure points. Prepping and draping was performed in the usual standard fashion. 40 ml of 0.25 % marcaine mixed with 1% lidocaine with epinephrine was given as a pudendal block.   On inspection, patient had a right posterior external hemorrhois. DRE revealed normal tone and no palpable masses. Anoscopy was then performed. Circumferential evaluation did not reveal any proctitis, masses  that required any attention. She had small internal hemorrhoids.     Excision was  performed  Initially at the right posterior hemorrhoid. Clamp was placed at the base making sure to spare the internal sphincter muscle and stay on top of it. Suture ligature of the base was performed with 2-0 Vicryl. Using lectrocautery, sparing the underlying musculature and the anoderm by making an elliptical incision, dissection was carried down lifting the hemorrhoidal plexus off the internal sphincter.  The hemorrhoidal tissue was excised. Closure was then performed with the same suture as a running locking stitch.  Next the right anterior internal hemorrhoids was excised using the same technique and closed with 3-0 vicryl.  Hemostasis was noted. Circumferential exploration was performed at the end with the anoscope noting very small internal hemorrhoid on the left, and hemostasis. At the end of the procedure, a medium sized hill ferguson retractor could easily be introduced, indicating no stenosis. I made sure to leave 1 cm of intervening anoderm between the columns excised.   Surgicel dressing was placed in the anal canal  4x4 mesh panties were placed.    ATTENDING  OPERATIVE NOTE  Division of Colon & Rectal Surgery   09/12/2019, 10:12      DVT Prophylaxis: SCD's  Specimens Removed:    ID Type Source Tests Collected by Time Destination   A : hemohrroid TISSUE ANUS SURGICAL PATHOLOGY [name] [name], MD 09/12/2019 0954      EBL:  2 ml  Drains:  none  Fluids: In: 350 [Crystalloid:350]  Out: 2 0000000   Complications: none  OUTCOME: The patient tolerated the  procedure well, and was taken to the recovery room in stable condition. Needle, instrument, and sponge counts were correct times two.  WOUND CLASSIFICATION Contaminated     Report electronically signed by  [name], MD Attending Physician    Potential Modifiers: Any of the Following Modifiers Apply To This Case?  -22 Unusual procedural service (must document what made it unusual)  no  -62 Co-Surgery (both surgeons must separately document their  portions no  -80 Pensions consultant (faculty assist - non Medicare patient) no  Charity fundraiser (faculty assist- Medicare patient, no qualified residents available) no  Are there any other factors about the case that are important for correct coding?  no    The information contained on this form is true and accurate to the best of my knowledge.  Further, I understand that if I misrepresent, falsify or conceal information regarding my participation in the professional service described above, I may be subject to fine, imprisonment, or civil penalty under applicable federal laws.    [name] MD   Assistant Professor   Division of Colon and Rectal Surgery  Department of Surgery  [facility]

## 2019-09-12 NOTE — H&P (Signed)
Colon and Rectal Surgery Clinic Note - Follow Up   Attending: Dr. [name] [phone], [phone])   Date of Appointment: 09/12/2019        CHIEF COMPLAINT / REASON FOR APPOINTMENT     External hemorrhoid    SUBJECTIVE     Kayla Morris is a 12yr femalewith h/o thrombosed external hemorrhoid.  Since the last visit, the patient is doing well overall. Denies bloody stool or rectal pain. No other complaint or novel symptoms at this time. She has not found good relief with conservative management.      COLORECTAL SURGICAL HISTORY     None.     PAST MEDICAL HISTORY     Active Ambulatory Problems     Diagnosis Date Noted    Asthma 03/27/2019    Hypertension 09/05/2019    Iron deficiency anemia 09/05/2019    Low back pain 09/05/2019    Prediabetes 09/05/2019     Resolved Ambulatory Problems     Diagnosis Date Noted    No Resolved Ambulatory Problems     Past Medical History:   Diagnosis Date    Back pain     Hemorrhoid     Submandibular lymphadenopathy LEFT         PAST SURGICAL HISTORY     Past Surgical History:   Procedure Laterality Date    NO SURGICAL HISTORY          MEDICATIONS     No current facility-administered medications on file prior to encounter.      Current Outpatient Medications on File Prior to Encounter   Medication    Hydrocortisone (ANUSOL-HC) 25 mg Suppository    NIFEdipine (ADALAT CC) 30 mg CR tablet    PNV [id number] fum/folic ac (PRENATAL PO)    Triamcinolone (KENALOG) 0.1 % Cream        ALLERGIES     No Known Allergies     SOCIAL HISTORY     Social History     Tobacco Use    Smoking status: Never Smoker    Smokeless tobacco: Never Used   Substance Use Topics    Alcohol use: Yes     Frequency: 2-4 times a month     Drinks per session: 1 or 2     Binge frequency: Never    Drug use: Never        FAMILY HISTORY     Family History   Problem Relation Name Age of Onset    Hypertension Mother      Hypertension Sister          REVIEW OF SYMPTOMS     Review of Systems  CONST:  denies fever, chills.  NEURO: denies loss of sensation, paresthesias.  PSYCH: denies hallucinations, delusions. Denies depression, anxiety.   SKIN: denies rashes or lesions.  GI: denies nausea, emesis. Denies diarrhea. Denies constipation.   URO: denies dysuria. Denies fecaluria and pneumaturia.   CV: denies chest pain, irregular heartbeats.  PULM: denies shortness of breath. Denies wheezing.   MUSC: denies loss of strength. Denies difficulty with upper and lower extremity use.   ENDO: denies hot/cold flashes. Denies lethargy. Denies excessive fatigue.       PHYSICAL EXAM     Recent Vital Signs:  Temp: 36.2 C (97.2 F) (12/15 0905)  Temp src: Temporal (12/15 0905)  Pulse: 82 (12/15 0905)  BP: 130/79 (12/15 0905)  Resp: 14 (12/15 0905)  SpO2: 100 % (12/15 0905)  Height: 172.7 cm (5\' 8" ) (12/15  [id number])  Weight: 73.5 kg (162 lb) (12/15 0905)    Physical Exam  Constitutional: Oriented to person, place, and time. Well-developed and well-nourished.   HEENT: Normocephalic and atraumatic. Nose normal. Conjunctivae and EOM are normal.   Neck: Normal range of motion. Neck supple. No tracheal deviation present.   Cardiovascular: Normal rate, regular rhythm and normal heart sounds.    Pulmonary/Chest: Effort normal and breath sounds normal. No respiratory distress. No wheezes.   Abdominal: Soft. No distension and no palpable mass. No tenderness. No rebound.   Musculoskeletal: Normal range of motion. No edema or deformity.   Neurological: alert and oriented to person, place, and time. No cranial nerve deficit.   Skin: Skin is warm. No rash noted. No pallor.   Psychiatric: Normal mood and affect. Behavior is normal. Judgment and thought content normal.      RECENT IMAGING     No recent imaging.     RECENT LABS     White Blood Cell Count   Date Value Ref Range Status   03/01/2018 6.6 4.5 - 11.0 K/MM3 Final     Hemoglobin   Date Value Ref Range Status   03/01/2018 12.6 12.0 - 16.0 g/dL Final     Platelet Count   Date Value Ref Range  Status   03/01/2018 334 130 - 400 K/MM3 Final     Sodium   Date Value Ref Range Status   03/01/2018 138 135 - 145 mmol/L Final     Potassium   Date Value Ref Range Status   03/01/2018 4.2 3.3 - 5.0 mmol/L Final     Creatinine Serum   Date Value Ref Range Status   03/01/2018 0.99 0.44 - 1.27 mg/dL Final     Albumin   Date Value Ref Range Status   03/01/2018 3.9 3.4 - 4.8 g/dL Final        ASSESSMENT AND PLAN     Kayla Morris is a 63yr female with external hemorrhoid and internal hemorrhoids. Patient is stable and doing well overall.     Risks and benefits explained.   Hemorrhoidectomy today  RTC post op         Kayla Morris Baldomero Lamy MD   Assistant Professor   Division of Colon and Rectal Surgery  Department of Surgery  [facility] [facility]

## 2019-09-12 NOTE — Telephone Encounter (Signed)
Caller Name: Belen    Reason for call: need note for work      Call back number: [phone]     Message to relay:     Pt just had surgery today and was told to be off work 2weeks. Pt need a note for work to be off. Please contact her back.       [facility]  [phone]

## 2019-09-12 NOTE — Discharge Instructions (Signed)
Kayla Morris, [facility] [phone]                                                    Nurses Line: [phone]              Today you had a :   Exam under anesthesia   Hemorrhoidectomy     APPOINTMENTS:    Your postoperative appointment is scheduled for 10/06/2019.   Call  Appointment Line [phone]  to make a post-operative appointment with in 3-4 weeks, if you do not already have one.   Please note a nurse will call you in 2-3 business days after your surgery to see how youare doing.If you do not have an appointment yet at that time ask them to transfer you to  Appointment Line [phone]    Wound Care:     You may feel some loose sutures. Do not pull on them. They can be trimmed at your next appointment. These sutures will eventually dissolve.     You have packing in the anus to absorb mild bleeding. It may fall out when you sit on the toilet. It may look like a dirty bloody tissue. It also may dissolve and be mixed with your first bowel movement and you will not see it.     Soak in warm water for 10 minutes, three times a day and after a bowel movement. If you cannot get in the tub, you can stand in the shower with your back towards the water, use a hand held sprayer, or use a Sitz bath placed on a toilet seat. If you need a Sitz Bath please let your nurse know before discharge. DO NOT put anything special in the water such as Epsom salts or Betadine as it will dry out your skin.     When you get out of the tub, tuck a dry gauze near the anus and near the wound. It will catch the drainage before it touches the skin, to avoid skin irritation. Avoid sanitary pads as they will not whisk away the moisture from the perianal area fast enough as the pads only touch the buttocks. You may then get a "diaper rash".     If you are not home and cannot perform your sitz bath after a bowel movement, use BABY WIPES for your personal  hygiene. DO NOT substitute with adult wipes, they tend to be too harsh on the skin.     DIET:   Resume your pre-op diet.    Add more fiber and drink 6-8 glasses of non-caffeinated beverages a day to avoid constipation. If you need an example of a  high fiber diet, we can provide one for you. Ask your nurse before you leave. You can also add  Fiber such as Benefiber or Metamucil  one serving  per day, and increase slowly (change dose on third day) as needed to prevent constipation .     If you do NOT have a bowel movement within three days, take one tablespoon of MOM (Milk of Magnesium). If no results, repeat again in 6 hours. If still no results, call the office.         Medications:   Take your medication as soon as  you are home. Do not wait for the pain to be severe enough to take the pain medication as it is harder to play "catch up" with the pain medication. Consider initially taking your pain medication as prescribed for 24-48 hours versus taking it as needed.    You can also take Aleve once in the morning and once at night with your pain medication OR Motrin/Ibuprofen, do not exceed 2400 mg in 24 hours. If you are a Crohn's patient please avoid ALeve, Motrin , Ibuprofen.     In addition to Aleve/Motrin/Ibuprofen and your Oxycodone you can also take Tylenol  ( Acetaminophen) ; do not exceed 4,000 mg  Of tylenol in 24 hours. Do not take if due to an abormal liver you were previously told not to take.     If you need more pain medications, call the nurses line  before 3:30 p.m., Monday through Friday. Pain medication WILL NOT be renewed after office hours during the weekdays or at anytime during the weekends. Therefore, call BEFORE  you run out of medication.       Activities:  1.You may walk slowly up and down steps if you are not dizzy from the pain medication.    2.Driving while taking narcotic pain medication is highly discouraged. Even if not taking narcotic pain medication,  if pain prevents you  from reacting quickly and safely, you should not be driving. If you are a passenger, move your feet as if you were pumping the gas pedal fairly regularly. If you are taking a trip, get out of the vehicle every 1-2 hours and walk around for 5-10 minutes. This will prevent clots in your legs.  3. Do not sign important documents for 24 hours after surgery  4. If you smoke, stop smoking  5. You may return back to work In 2  Weeks or as you feel ready noting your restrictions. If you need a note for work please call the office.   6. Sexual activity:  wait for 4 weeks   7. Do not insert anything in your anus/rectum without consulting with the office.    Call office if:  1. Fever over 101 degrees F  2. Increased swelling  3. If  the wound appears red and swollen, if a previously dry wound begins to have drainage  4. New abdominal pain  5. If you are having more than 5 bowel movements per day, or no bowel movements for 3 days ( and you have already taken 2 does of MOM)        General Postoperative Instructions:    Immediate post-op period:  It is normal to feel dizzy and sleepy for several hours after your operation.  Therefore, you should not drive, operate any equipment, sign any important papers or make any significant decisions until the next day.    Diet: Start with clear liquids.  Progress to a normal diet as tolerated.  You have received pain medication that may make you sick to your stomach.  Soups and foods that are easy to digest are best tolerated as you begin to eat (avoid spicy and fatty food).  Drink plenty of fluids.  Do not drink alcoholic beverages for 24 hours.    Emergency Contact numbers:    Daytime hours call your surgeon's [phone]    Night/weekend hours:  Call the hospital operator @ [phone] and ask them to page the resident on call for  Colorectal.

## 2019-09-13 ENCOUNTER — Telehealth: Payer: Self-pay | Admitting: Surgery

## 2019-09-13 ENCOUNTER — Encounter: Payer: Self-pay | Admitting: Surgery

## 2019-09-13 NOTE — Telephone Encounter (Signed)
FYI: No further action needed with regard to this call.    Post-discharge Follow Up Phone Call Program    Provider:    Cheri Kearns, MD  Last attending  Treatment team   [id number], Cloyde Reams, MD    Per chart review documentation copied and pasted below:  Procedure Performed/Description:  Procedure(s):  HEMORRHOIDECTOMY     Received an alert or voicemail message from support at [facility] that the patient has questions related to:  No issue triggered. Patient Feedback VM transcription copied and pasted below:  "Hello, this is Gabbrielle at away. I did have a few questions about doctor's letter and I saw it there. She said no fixed before we just had a question about that as well. Alright?"    -Per chart review: Patient in contact with clinic regarding work note request 09/12/2019. Message was routed to nursing pool by [facility].    Attempted to reach patient. No answer. Left generic message on unidentified VM to please call back [phone] with any questions or concerns or [phone].    Second attempt to reach patient. Mychart message sent.   -No call back received.     -Per chart review: Work note issue is being addressed by clinic nurse Ma. Honalo, [place].  No further action needed.     Patient unreachable. Encounter closed.   Leeroy Cha, Theatre stage manager Program  [phone] - Post-discharge call department    Please send all EMR Responses to our work department pool:   P [facility] [place]

## 2019-09-13 NOTE — Telephone Encounter (Signed)
Caller Name: brietta    Reason for call: postop questions    Call back number: [phone] (home)      Message to relay:     Patient had surgery with Dr [name] 12/15, patient needing note and also has questions, does patient need to wait four weeks for sexual activity. Please contact patient.

## 2019-09-13 NOTE — Telephone Encounter (Signed)
Spoke with Dr. Trevor Mace in clinic regarding pt request for work release note. Per MD okay to write letter for pt to be off work from 12/15 (surgery date) until 12/29 (2 weeks); return to work 12/30 with no restrictions.         Follow-up call: Called patient.  ID confirmed using 3 identifiers - name, DOB and address.  Spoke with patient and advised that work release notes was drawn and sent to her mychart account.   Patient  verbalizes understanding and thanked Therapist, sports for the call.           Disposition:  FYI only:  Dr. [name] Dr. Daphine Deutscher.                 Kayla Morris. Leodis [place] de [place] Sauer, RN-BC, BSN  Clinical Nurse II  [facility]  [phone]

## 2019-09-13 NOTE — Telephone Encounter (Signed)
Caller Name: righteous  Reason for call: work note request  Call back number: [phone] (home)      Message to relay:     Patient had surgery with Dr [name] 12/15, patient needing note and also has questions, does patient need to wait four weeks for sexual activity. Please contact patient.    Dx: s/p Hemorrhoidectomy yesterday, 12/15.   Follow-up: 10/06/19.       Follow-up call: Called patient.  ID confirmed using 3 identifiers - name, DOB and address.  Spoke with patient and discussed [facility] intructions:    Activities:  1.You may walk slowly up and down steps if you are not dizzy from the pain medication.  2.Driving while taking narcotic pain medication is highly discouraged. Even if not taking narcotic pain medication, if pain prevents you from reacting quickly and safely, you should not be driving. If you are a passenger, move your feet as if you were pumping the gas pedal fairly regularly. If you are taking a trip, get out of the vehicle every 1-2 hours and walk around for 5-10 minutes. This will prevent clots in your legs.  3. Do not sign important documents for 24 hours after surgery  4. If you smoke, stop smoking  5. You may return back to work In 2 Weeks or as you feel ready noting your restrictions.    6. Sexual activity: wait for 4 weeks  7. Do not insert anything in your anus/rectum without consulting with the office.  Patient verbalized understanding.             Disposition:  FYI:  Dr. Ruthell Rummage, RN/ Dr. Trevor Mace.               Kayla Morris. Kayla [place] Sauer, RN-BC, BSN  Clinical Nurse II  [facility]  [phone]

## 2019-09-14 LAB — SURGICAL PATHOLOGY

## 2019-09-26 ENCOUNTER — Encounter: Payer: Self-pay | Admitting: Family Medicine

## 2019-09-26 NOTE — Progress Notes (Signed)
I sent a myhealth message to patient and waiting for her response.

## 2019-09-29 HISTORY — PX: HYSTEROSCOPY WITH RESECTOSCOPE: SHX5395

## 2019-10-04 NOTE — Progress Notes (Addendum)
Colon and Rectal Surgery Clinic Note - Post-Op Visit   Attending: Dr. [name] [phone], [phone])   Date of Appointment: 10/06/2019        CHIEF COMPLAINT     s/p hemorrhoidectomy on 09/12/19    SUBJECTIVE     Kayla Morris is a 39yr old woman here for followup of a hemorrhoidectomy performed on 09/12/19 for thrombosed external hemorrhoid and internal hemorrhoid (right posterior).     Since the procedure, the patient is doing well overall. Denies pain, bleeding, or perianal drainage. Taking metamucil and high fiber diet, and having regular BMs. Appetite is intact and she is tolerating PO without nausea or vomiting. No fever.      COLORECTAL SURGICAL HISTORY     09/12/19- Hemorrhoidectomy      MEDICATIONS     Current Outpatient Medications on File Prior to Visit   Medication    Cyclobenzaprine (FLEXERIL) 10 mg Tablet    Hydrocortisone (ANUSOL-HC) 25 mg Suppository    Naloxone (NARCAN) 4 mg/actuation Nasal Spray    Naproxen (NAPROSYN) 500 mg Tablet    NIFEdipine (ADALAT CC) 30 mg CR tablet    PNV [id number] fum/folic ac (PRENATAL PO)    Triamcinolone (KENALOG) 0.1 % Cream     No current facility-administered medications on file prior to visit.         REVIEW OF SYMPTOMS     Review of Systems  CONST: denies fever, chills.  NEURO: denies loss of sensation, paresthesias.  PSYCH: denies hallucinations, delusions. Denies depression, anxiety.   SKIN: denies rashes or lesions.  GI: denies nausea, emesis. Denies diarrhea. Denies constipation.   URO: denies dysuria. Denies fecaluria and pneumaturia.   CV: denies chest pain, irregular heartbeats.  PULM: denies shortness of breath. Denies wheezing.   MUSC: denies loss of strength. Denies difficulty with upper and lower extremity use.   ENDO: denies hot/cold flashes. Denies lethargy. Denies excessive fatigue.       PHYSICAL EXAM     Recent Vital Signs:  Temp: 36.3 C (97.3 F) (01/08 0932)  Temp src: Skin (01/08 0932)  Pulse: 72 (01/08 0932)  BP: 151/92 (01/08  0932)  Resp: 16 (01/08 0932)  SpO2: 100 % (01/08 0932)  Height: --  Weight: 74.3 kg (163 lb 12.8 oz) (01/08 0932)    Physical Examination:  Constitutional: Oriented to person, place, and time. Well-developed and well-nourished.   HEENT: Normocephalic and atraumatic. Nose normal. Conjunctivae and EOM are normal.   Neck: No tracheal deviation present.   Cardiovascular: Normal rate, regular rhythm  Pulmonary/Chest: breathing comfortably on room air   Abdominal: Soft. No distension, nontender  Chaperoned by Kayla Morris.  Rectal: Surgical site healing well, no erythema or drainage. No anal stenosis.  Musculoskeletal: Normal range of motion. No edema or deformity.   Neurological: alert and oriented to person, place, and time. No cranial nerve deficit.   Skin: Skin is warm. No rash noted. No pallor.   Psychiatric: Normal mood and affect. Behavior is normal. Judgment and thought content normal.      RELEVANT IMAGING AND/OR PATHOLOGY RESULTS     White Blood Cell Count   Date Value Ref Range Status   03/01/2018 6.6 4.5 - 11.0 K/MM3 Final     Hemoglobin   Date Value Ref Range Status   03/01/2018 12.6 12.0 - 16.0 g/dL Final     Platelet Count   Date Value Ref Range Status   03/01/2018 334 130 - 400 K/MM3 Final  Sodium   Date Value Ref Range Status   03/01/2018 138 135 - 145 mmol/L Final     Potassium   Date Value Ref Range Status   03/01/2018 4.2 3.3 - 5.0 mmol/L Final     Creatinine Serum   Date Value Ref Range Status   03/01/2018 0.99 0.44 - 1.27 mg/dL Final     Albumin   Date Value Ref Range Status   03/01/2018 3.9 3.4 - 4.8 g/dL Final       Surgical Pathology:  A.        ANUS, hemorrhoid (HEMORRHOIDECTOMY):  -   Hemorrhoidal fibroepithelal polyp  -   Negative for dysplasia or malignancy        ASSESSMENT AND PLAN     Kayla Morris is s/p hemorrhoidectomy, doing well. She appears to be stable and recovering well, without evidence of post-operative complication. We discussed her pathology results that show hemorrhoidal  fibroepithelal polyp, negative for dysplasia or malignancy.     - Continue psyllium supplement  - Continue high fiber diet, at least 30 grams per day  - Drink plenty of water, at least 72 oz per day  - Avoid straining with bowel movements  - Avoid sitting on the toilet for prolonged periods  - Wash the area with warm water, pat dry. Avoid using soap.   - Patient will be moving to [facility].     - RTC in PRN.     SCRIBE DISCLAIMER:  This note was scribed by Kayla Morris, SCRIBE, a trained medical scribe, in the presence of Kayla Baldomero Lamy, MD. Electronically signed by Kayla Morris, SCRIBE, Scribe  10/04/2019  10:13 AM     **WHDISCLAIMER**     I saw and evaluated the patient and went over the HPI, past history, reviewed the medications as documented in the note. Please see the note for details.   I personally performed the services described in this documentation as scribed by Kayla Morris in my presence, and it is both accurate and complete.     Kayla Baldomero Lamy MD   Assistant Professor   Division of Colon and Rectal Surgery  Department of Surgery  [facility]       .

## 2019-10-05 ENCOUNTER — Other Ambulatory Visit: Payer: Self-pay | Admitting: Family Medicine

## 2019-10-05 DIAGNOSIS — M542 Cervicalgia: Secondary | ICD-10-CM

## 2019-10-05 DIAGNOSIS — M546 Pain in thoracic spine: Secondary | ICD-10-CM

## 2019-10-05 NOTE — Telephone Encounter (Signed)
Last filled: 09/08/19  Last seen: 09/08/19

## 2019-10-05 NOTE — Progress Notes (Signed)
Patient is up to date with her pap smear and care gap has been updated.

## 2019-10-06 ENCOUNTER — Ambulatory Visit: Payer: 59 | Attending: Surgery | Admitting: Surgery

## 2019-10-06 VITALS — BP 151/92 | HR 72 | Temp 97.3°F | Resp 16 | Wt 163.8 lb

## 2019-10-06 DIAGNOSIS — Z48815 Encounter for surgical aftercare following surgery on the digestive system: Secondary | ICD-10-CM | POA: Insufficient documentation

## 2019-10-06 DIAGNOSIS — K645 Perianal venous thrombosis: Secondary | ICD-10-CM

## 2019-10-06 NOTE — Nursing Note (Signed)
Recheck patient blood pressure . Notified DR HALABI. Documented both vitals on flow sheet      Kayla Mcclane, MA II  Outpatient Surgery Department, [facility]  [phone]

## 2019-10-06 NOTE — Nursing Note (Signed)
Temp: 36.3 C (97.3 F) (01/08 0932)  Temp src: Skin (01/08 0932)  Pulse: 72 (01/08 0932)  BP: 151/92 (01/08 0932)  Resp: 16 (01/08 0932)  SpO2: 100 % (01/08 0932)  Height: --  Weight: 74.3 kg (163 lb 12.8 oz) (01/08 0932)    Vital signs taken, chief complaint noted, allergies verified and screened for pain. Mobility screened for patients over the age of 78. Tobacco use reviewed and preferred pharmacy verified. Medication reconciliation initiated with printed list of current medications to be reviewed and edited by the patient.     Patient WAS wearing a surgical mask  Contact precautions were followed when caring for the patient.   PPE used by provider during encounter: Surgical mask     Jenkins Rouge, [place] II  Outpatient Surgery Department, [facility]  [phone]

## 2020-05-06 LAB — HAPPY TOGETHER UNMAPPED RESULTS
ABO Group: B POS
RH Type_Outside Org: POSITIVE

## 2020-09-28 DIAGNOSIS — Z8616 Personal history of COVID-19: Secondary | ICD-10-CM

## 2020-09-28 HISTORY — DX: Personal history of COVID-19: Z86.16

## 2020-09-28 NOTE — L&D Delivery Note (Signed)
Patient delivered previable stillborn fetus, awaiting delivery of the placenta with IV pitocin given now  Christina Phenix, MD 02/18/2021 10:57 AM

## 2020-12-15 ENCOUNTER — Emergency Department (HOSPITAL_BASED_OUTPATIENT_CLINIC_OR_DEPARTMENT_OTHER)
Admission: EM | Admit: 2020-12-15 | Discharge: 2020-12-15 | Disposition: A | Discharge status: Home / Self Care | Payer: BC Managed Care – PPO | Attending: Emergency Medicine | Admitting: Emergency Medicine

## 2020-12-15 ENCOUNTER — Other Ambulatory Visit: Payer: Self-pay

## 2020-12-15 ENCOUNTER — Emergency Department (HOSPITAL_BASED_OUTPATIENT_CLINIC_OR_DEPARTMENT_OTHER): Payer: BC Managed Care – PPO

## 2020-12-15 ENCOUNTER — Encounter (HOSPITAL_BASED_OUTPATIENT_CLINIC_OR_DEPARTMENT_OTHER): Payer: Self-pay

## 2020-12-15 DIAGNOSIS — O10011 Pre-existing essential hypertension complicating pregnancy, first trimester: Secondary | ICD-10-CM | POA: Insufficient documentation

## 2020-12-15 DIAGNOSIS — O208 Other hemorrhage in early pregnancy: Secondary | ICD-10-CM | POA: Diagnosis present

## 2020-12-15 DIAGNOSIS — O209 Hemorrhage in early pregnancy, unspecified: Secondary | ICD-10-CM

## 2020-12-15 DIAGNOSIS — Z3A01 Less than 8 weeks gestation of pregnancy: Secondary | ICD-10-CM | POA: Insufficient documentation

## 2020-12-15 DIAGNOSIS — Z3490 Encounter for supervision of normal pregnancy, unspecified, unspecified trimester: Secondary | ICD-10-CM

## 2020-12-15 DIAGNOSIS — Z79899 Other long term (current) drug therapy: Secondary | ICD-10-CM | POA: Insufficient documentation

## 2020-12-15 DIAGNOSIS — N939 Abnormal uterine and vaginal bleeding, unspecified: Secondary | ICD-10-CM

## 2020-12-15 HISTORY — DX: Essential (primary) hypertension: I10

## 2020-12-15 LAB — URINALYSIS, ROUTINE W REFLEX MICROSCOPIC
Bilirubin Urine: NEGATIVE
Glucose, UA: NEGATIVE mg/dL
Ketones, ur: NEGATIVE mg/dL
Leukocytes,Ua: NEGATIVE
Nitrite: NEGATIVE
Protein, ur: NEGATIVE mg/dL
Specific Gravity, Urine: 1.012 (ref 1.005–1.030)
pH: 7.5 (ref 5.0–8.0)

## 2020-12-15 LAB — WET PREP, GENITAL
Clue Cells Wet Prep HPF POC: NONE SEEN
Sperm: NONE SEEN
Trich, Wet Prep: NONE SEEN
WBC, Wet Prep HPF POC: NONE SEEN
Yeast Wet Prep HPF POC: NONE SEEN

## 2020-12-15 LAB — CBC WITH DIFFERENTIAL/PLATELET
Abs Immature Granulocytes: 0.01 10*3/uL (ref 0.00–0.07)
Basophils Absolute: 0 10*3/uL (ref 0.0–0.1)
Basophils Relative: 0 %
Eosinophils Absolute: 0.1 10*3/uL (ref 0.0–0.5)
Eosinophils Relative: 1 %
HCT: 38.7 % (ref 36.0–46.0)
Hemoglobin: 13.5 g/dL (ref 12.0–15.0)
Immature Granulocytes: 0 %
Lymphocytes Relative: 18 %
Lymphs Abs: 1.5 10*3/uL (ref 0.7–4.0)
MCH: 30.4 pg (ref 26.0–34.0)
MCHC: 34.9 g/dL (ref 30.0–36.0)
MCV: 87.2 fL (ref 80.0–100.0)
Monocytes Absolute: 0.7 10*3/uL (ref 0.1–1.0)
Monocytes Relative: 7 %
Neutro Abs: 6.5 10*3/uL (ref 1.7–7.7)
Neutrophils Relative %: 74 %
Platelets: 223 10*3/uL (ref 150–400)
RBC: 4.44 MIL/uL (ref 3.87–5.11)
RDW: 14.5 % (ref 11.5–15.5)
WBC: 8.8 10*3/uL (ref 4.0–10.5)
nRBC: 0 % (ref 0.0–0.2)

## 2020-12-15 LAB — PREGNANCY, URINE: Preg Test, Ur: POSITIVE — AB

## 2020-12-15 NOTE — ED Triage Notes (Signed)
Pt reports that she is [redacted] weeks pregnant and having spotting with one small clot. Cramping this morning at 0900. No pain at this time

## 2020-12-15 NOTE — ED Provider Notes (Signed)
Received patient in turnover from Dr. Delford Field.  Please see their note for further details of Hx, PE.  Briefly patient is a 40 y.o. female with a Vaginal Bleeding .  Found to be pregnant, HCG 1357.  Awaiting [place] results. My view of [place] with IUP approx 5w 6 days.  Subchorionic hemorrhage.   Blood type B+.  Discharge home.  OB/GYN follow-up.    Melene Plan, DO 12/15/20 Izola Price

## 2020-12-15 NOTE — ED Provider Notes (Signed)
[facility] EMERGENCY DEPT Provider Note   CSN: [id number] Arrival date & time: 12/15/20  1304     History Chief Complaint  Patient presents with  . Vaginal Bleeding    Christina Valdez is a 40 y.o. female.  G1P0.  Is a patient at the [place], and she received a confirmatory urine pregnancy test.  However, she has not had an OB/GYN appointment yet.  The history is provided by the patient.  Vaginal Bleeding Quality:  Bright red and spotting Severity:  Mild Onset quality:  Sudden Timing:  Intermittent Progression:  Unchanged Chronicity:  New Menstrual history:  Regular (LMP possibly 11/05/20) Possible pregnancy: yes   Context: spontaneously   Relieved by:  Nothing Worsened by:  Nothing Associated symptoms: abdominal pain (lower abdominal cramping, now gone)   Associated symptoms: no back pain, no dysuria, no fever and no nausea        Past Medical History:  Diagnosis Date  . Hypertension     There are no problems to display for this patient.   History reviewed. No pertinent surgical history.   OB History    Gravida  1   Para      Term      Preterm      AB      Living        SAB      IAB      Ectopic      Multiple      Live Births              History reviewed. No pertinent family history.  Social History   Tobacco Use  . Smoking status: Never Smoker  . Smokeless tobacco: Never Used  Vaping Use  . Vaping Use: Never used  Substance Use Topics  . Alcohol use: Not Currently  . Drug use: Never    Home Medications Prior to Admission medications   Medication Sig Start Date End Date Taking? Authorizing Provider  losartan (COZAAR) 25 MG tablet Take 12.5 mg by mouth daily. 10/25/20   [provider]    Allergies    Patient has no allergy information on record.  Review of Systems   Review of Systems  Constitutional: Negative for chills and fever.  HENT: Negative for ear pain and sore throat.   Eyes: Negative for  pain and visual disturbance.  Respiratory: Negative for cough and shortness of breath.   Cardiovascular: Negative for chest pain and palpitations.  Gastrointestinal: Positive for abdominal pain (lower abdominal cramping, now gone). Negative for nausea and vomiting.  Genitourinary: Positive for vaginal bleeding. Negative for dysuria and hematuria.  Musculoskeletal: Negative for arthralgias and back pain.  Skin: Negative for color change and rash.  Neurological: Negative for seizures and syncope.  All other systems reviewed and are negative.   Physical Exam Updated Vital Signs BP (!) 160/114 (BP Location: Right Arm)   Pulse 64   Temp 98.7 F (37.1 C) (Oral)   Resp 18   Ht 5\' 8"  (1.727 m)   Wt 83.9 kg   LMP 11/05/2020   SpO2 100%   BMI 28.13 kg/m   Physical Exam Vitals and nursing note reviewed. Exam conducted with a chaperone present.  HENT:     Head: Normocephalic and atraumatic.  Eyes:     General: No scleral icterus. Pulmonary:     Effort: Pulmonary effort is normal. No respiratory distress.  Abdominal:     General: There is no distension.  Tenderness: There is no abdominal tenderness.  Genitourinary:    General: Normal vulva.     Exam position: Lithotomy position.     Cervix: Cervical bleeding present. No cervical motion tenderness.     Uterus: Not tender.      Adnexa:        Right: No tenderness.         Left: No tenderness.       Comments: Mild brown vaginal bleeding. Os closed. Musculoskeletal:     Cervical back: Normal range of motion.  Skin:    General: Skin is warm and dry.  Neurological:     Mental Status: She is alert.  Psychiatric:        Mood and Affect: Mood normal.     ED Results / Procedures / Treatments   Labs (all labs ordered are listed, but only abnormal results are displayed) Labs Reviewed  URINALYSIS, ROUTINE W REFLEX MICROSCOPIC - Abnormal; Notable for the following components:      Result Value   Color, Urine COLORLESS (*)     Hgb urine dipstick SMALL (*)    Bacteria, UA RARE (*)    All other components within normal limits  PREGNANCY, URINE - Abnormal; Notable for the following components:   Preg Test, Ur POSITIVE (*)    All other components within normal limits  HCG, QUANTITATIVE, PREGNANCY - Abnormal; Notable for the following components:   hCG, Beta Chain, Quant, S >1,357 (*)    All other components within normal limits  WET PREP, GENITAL  CBC WITH DIFFERENTIAL/PLATELET  ABO/RH  GC/CHLAMYDIA PROBE AMP () NOT AT [employer]    EKG None  Radiology US OB LESS THAN 14 WEEKS WITH OB TRANSVAGINAL  Result Date: 12/15/2020 CLINICAL DATA:  Vaginal bleeding EXAM: OBSTETRIC <14 WK [place] AND TRANSVAGINAL OB US TECHNIQUE: Both transabdominal and transvaginal ultrasound examinations were performed for complete evaluation of the gestation as well as the maternal uterus, adnexal regions, and pelvic cul-de-sac. Transvaginal technique was performed to assess early pregnancy. COMPARISON:  None. FINDINGS: Intrauterine gestational sac: Single Yolk sac:  Visualized. Embryo:  Visualized. Cardiac Activity: Visualized. Heart Rate: 104 bpm MSD:   mm    w     d CRL:  2.4 mm   5 w   6 d                  [place] [facility]: [id number] Subchorionic hemorrhage:  Small 2 moderate subchorionic hemorrhage. Maternal uterus/adnexae: Fibroids noted. Fundal fibroid measures up to 1.3 cm. Posterior mid body fibroid measures up to 2 cm. No adnexal mass or free fluid. Fluid collection or cystic area within the vagina measuring 3.2 x 2.3 x 1.7 cm. IMPRESSION: Five week 6 day intrauterine pregnancy. Fetal heart rate 104 beats per minute. Small to moderate subchorionic hemorrhage. Fluid collection or cyst in the vagina measuring up to 3.2 cm. This could be further evaluated with direct visualization. Small fibroids. Electronically Signed   By: Charlett Nose M.D.   On: 12/15/2020 15:24    Procedures Procedures   Medications Ordered in ED Medications - No data to  display  ED Course  I have reviewed the triage vital signs and the nursing notes.  Pertinent labs & imaging results that were available during my care of the patient were reviewed by me and considered in my medical decision making (see chart for details).    MDM Rules/Calculators/A&P  Christina Valdez presents with vaginal bleeding.  She is in her first trimester pregnancy.  Vital signs are within normal limits.  She was evaluated and was found to have a small subchorionic hemorrhage.  She was given instructions on pelvic rest and instructed to follow-up with OB/GYN.  Final Clinical Impression(s) / ED Diagnoses Final diagnoses:  Vaginal bleeding in pregnancy, first trimester    Rx / DC Orders ED Discharge Orders    None       Koleen Distance, MD 12/17/20 [phone]

## 2020-12-15 NOTE — Discharge Instructions (Signed)
Return for worsening pain, passing out, worsening bleeding.

## 2020-12-16 LAB — GC/CHLAMYDIA PROBE AMP (~~LOC~~) NOT AT ARMC
Chlamydia: NEGATIVE
Comment: NEGATIVE
Comment: NORMAL
Neisseria Gonorrhea: NEGATIVE

## 2020-12-16 LAB — ABO/RH: ABO/RH(D): B POS

## 2020-12-17 ENCOUNTER — Telehealth (HOSPITAL_BASED_OUTPATIENT_CLINIC_OR_DEPARTMENT_OTHER): Payer: Self-pay | Admitting: *Deleted

## 2020-12-17 LAB — HCG, QUANTITATIVE, PREGNANCY: hCG, Beta Chain, Quant, S: 14925 m[IU]/mL — ABNORMAL HIGH (ref <5)

## 2020-12-17 NOTE — Telephone Encounter (Signed)
Lab called to update the HCG result of 14,925.  Dr. Lockie Mola was made aware and checked the patient's results.  No action noted at this time.

## 2021-01-15 ENCOUNTER — Ambulatory Visit (INDEPENDENT_AMBULATORY_CARE_PROVIDER_SITE_OTHER): Payer: BC Managed Care – PPO

## 2021-01-15 DIAGNOSIS — O09519 Supervision of elderly primigravida, unspecified trimester: Secondary | ICD-10-CM | POA: Insufficient documentation

## 2021-01-15 DIAGNOSIS — Z3A10 10 weeks gestation of pregnancy: Secondary | ICD-10-CM

## 2021-01-15 DIAGNOSIS — O09511 Supervision of elderly primigravida, first trimester: Secondary | ICD-10-CM

## 2021-01-15 NOTE — Progress Notes (Signed)
  Virtual Visit via Telephone Note  I connected with Christina Valdez on 01/15/21 at  9:00 AM EDT by telephone and verified that I am speaking with the correct person using two identifiers.  Location: Patient: Home Provider: Haskel Khan    I discussed the limitations, risks, security and privacy concerns of performing an evaluation and management service by telephone and the availability of in person appointments. I also discussed with the patient that there may be a patient responsible charge related to this service. The patient expressed understanding and agreed to proceed.   History of Present Illness: PRENATAL INTAKE SUMMARY  Christina Valdez presents today New OB Nurse Interview.  OB History    Gravida  1   Para      Term      Preterm      AB      Living        SAB      IAB      Ectopic      Multiple      Live Births             I have reviewed the patient's medical, obstetrical, social, and family histories, medications, and available lab results.  SUBJECTIVE She has no unusual complaints.   Observations/Objective: Initial nurse interview for history/labs (New OB)  EDD: 08/11/2021 GA: [redacted]w[redacted]d G1P0 FHT: non face to face interview   GENERAL APPEARANCE: oriented to person, place and time, non face to face interview   Assessment and Plan: High Risk Pregnancy - AMA  Prenatal care: [facility]  Labs/Exam to be completed at next visit  Download Babyscripts  Pt has BP cuff Sign up for Mychart  PHQ9= 6 - pt requests counseling services  GAD7= 1  Follow Up Instructions:   I discussed the assessment and treatment plan with the patient. The patient was provided an opportunity to ask questions and all were answered. The patient agreed with the plan and demonstrated an understanding of the instructions.   The patient was advised to call back or seek an in-person evaluation if the symptoms worsen or if the condition fails to improve as anticipated.  I provided  20 minutes of non-face-to-face time during this encounter.   Dalphine Handing, CMA

## 2021-01-22 ENCOUNTER — Ambulatory Visit (INDEPENDENT_AMBULATORY_CARE_PROVIDER_SITE_OTHER): Payer: BC Managed Care – PPO

## 2021-01-22 ENCOUNTER — Other Ambulatory Visit (HOSPITAL_COMMUNITY)
Admission: RE | Admit: 2021-01-22 | Discharge: 2021-01-22 | Disposition: A | Discharge status: Home / Self Care | Payer: BC Managed Care – PPO | Source: Ambulatory Visit | Attending: Obstetrics and Gynecology | Admitting: Obstetrics and Gynecology

## 2021-01-22 ENCOUNTER — Encounter: Payer: Self-pay | Admitting: Obstetrics and Gynecology

## 2021-01-22 ENCOUNTER — Other Ambulatory Visit: Payer: Self-pay

## 2021-01-22 ENCOUNTER — Ambulatory Visit (INDEPENDENT_AMBULATORY_CARE_PROVIDER_SITE_OTHER): Payer: BC Managed Care – PPO | Admitting: Obstetrics and Gynecology

## 2021-01-22 VITALS — BP 151/90 | HR 74 | Wt 197.0 lb

## 2021-01-22 DIAGNOSIS — Z3A11 11 weeks gestation of pregnancy: Secondary | ICD-10-CM | POA: Diagnosis not present

## 2021-01-22 DIAGNOSIS — O10919 Unspecified pre-existing hypertension complicating pregnancy, unspecified trimester: Secondary | ICD-10-CM | POA: Diagnosis present

## 2021-01-22 DIAGNOSIS — O10911 Unspecified pre-existing hypertension complicating pregnancy, first trimester: Secondary | ICD-10-CM | POA: Diagnosis present

## 2021-01-22 DIAGNOSIS — Z113 Encounter for screening for infections with a predominantly sexual mode of transmission: Secondary | ICD-10-CM | POA: Diagnosis not present

## 2021-01-22 DIAGNOSIS — O09511 Supervision of elderly primigravida, first trimester: Secondary | ICD-10-CM | POA: Insufficient documentation

## 2021-01-22 DIAGNOSIS — O0901 Supervision of pregnancy with history of infertility, first trimester: Secondary | ICD-10-CM | POA: Insufficient documentation

## 2021-01-22 DIAGNOSIS — O368390 Maternal care for abnormalities of the fetal heart rate or rhythm, unspecified trimester, not applicable or unspecified: Secondary | ICD-10-CM

## 2021-01-22 DIAGNOSIS — O09519 Supervision of elderly primigravida, unspecified trimester: Secondary | ICD-10-CM | POA: Diagnosis present

## 2021-01-22 DIAGNOSIS — Z124 Encounter for screening for malignant neoplasm of cervix: Secondary | ICD-10-CM | POA: Diagnosis not present

## 2021-01-22 DIAGNOSIS — O368311 Maternal care for abnormalities of the fetal heart rate or rhythm, first trimester, fetus 1: Secondary | ICD-10-CM

## 2021-01-22 MED ORDER — ASPIRIN EC 81 MG PO TBEC: 81.0000 mg | DELAYED_RELEASE_TABLET | Freq: Every day | ORAL | 2 refills | Status: DC

## 2021-01-22 MED ORDER — LABETALOL HCL 200 MG PO TABS: 200.0000 mg | ORAL_TABLET | Freq: Two times a day (BID) | ORAL | 3 refills | Status: DC

## 2021-01-22 NOTE — Progress Notes (Signed)
Subjective:  Christina Valdez is a 40 y.o. G1P0 at 61w2dbeing seen today for first OB visit. ED[facility] test.  H/O REI for infertility but this pregnancy was spontaneous.  She is currently monitored for the following issues for this high-risk pregnancy and has Encounter for supervision of high risk primigravida of advanced maternal age, antepartum and Chronic hypertension affecting pregnancy on their problem list.  Patient reports some spotting.  Contractions: Not present. Vag. Bleeding: Small.  Movement: Absent. Denies leaking of fluid.   The following portions of the patient's history were reviewed and updated as appropriate: allergies, current medications, past family history, past medical history, past social history, past surgical history and problem list. Problem list updated.  Objective:   Vitals:   01/22/21 1418  BP: (!) 151/90  Pulse: 74  Weight: 197 lb (89.4 kg)    Fetal Status: Fetal Heart Rate (bpm): 158   Movement: Absent     General:  Alert, oriented and cooperative. Patient is in no acute distress.  Skin: Skin is warm and dry. No rash noted.   Cardiovascular: Normal heart rate noted  Respiratory: Normal respiratory effort, no problems with respiration noted  Abdomen: Soft, gravid, appropriate for gestational age. Pain/Pressure: Absent     Pelvic:  Cervical exam performed        Extremities: Normal range of motion.  Edema: None  Mental Status: Normal mood and affect. Normal behavior. Normal judgment and thought content.   Urinalysis:      Assessment and Plan:  Pregnancy: G1P0 at 153w2d1. Unable to hear fetal heart tones as reason for ultrasound scan  - [place]; Future  2. Encounter for supervision of high risk primigravida of advanced maternal age, antepartum Prenatal care and labs reviewed with pt. Genetic testing discussed - CBC/D/Plt+RPR+Rh+ABO+Rub Ab... - Cervicovaginal  ancillary only( [place]) - Culture, OB Urine - Genetic Screening - Protein / creatinine ratio, urine - Comp Met (CMET) - TSH - Hemoglobin A1c  3. Chronic hypertension affecting pregnancy CHTN and pregnancy reviewed. Pt instructed on BP monitoring at home Will start Labetalol and BASA, Indications reviewed Serial growth scans and antenatal testing discussed - CBC/D/Plt+RPR+Rh+ABO+Rub Ab... - Cervicovaginal ancillary only( [place]) - Culture, OB Urine - Genetic Screening - Protein / creatinine ratio, urine - Comp Met (CMET) - TSH - Hemoglobin A1c - labetalol (NORMODYNE) 200 MG tablet; Take 1 tablet (200 mg total) by mouth 2 (two) times daily.  Dispense: 60 tablet; Refill: 3      Preterm labor symptoms and general obstetric precautions including but not limited to vaginal bleeding, contractions, leaking of fluid and fetal movement were reviewed in detail with the patient. Please refer to After Visit Summary for other counseling recommendations.  Return in about 4 weeks (around 02/19/2021) for OB visit, face to face, MD only.   ErChancy MilroyMD

## 2021-01-22 NOTE — Progress Notes (Signed)
NOB   Intake completed 01/15/21 PHQ=6 pt requested counseling services  Referral was  paced to Behavioral Health appt is on 01/28/21 w/Andera.    Genetic Screening : Desired  *Last pap: 12/2019 WNL per pt    QV:ZDGLOVF spotting x 3 days now. Pt has concerns about previous B/P Rx was on Losartan. Has not taken Rx in about1 month.  No HA's no visual changes.   FHT's not heard will need provider assistance w/ handheld U/S..per provider pt needs U/S for FHT's.

## 2021-01-22 NOTE — Patient Instructions (Signed)
Obstetrics: Normal and Problem Pregnancies (7th ed., pp. 102-121). [place], [place]: Elsevier."> Textbook of Family Medicine (9th ed., pp. 365-410). [place], [place]: Else[employer].">  First Trimester of Pregnancy  The first trimester of pregnancy starts on the first day of your last menstrual period until the end of week 12. This is months 1 through 3 of pregnancy. A week after a sperm fertilizes an egg, the egg will implant into the wall of the uterus and begin to develop into a baby. By the end of 12 weeks, all the baby's organs will be formed and the baby will be 2-3 inches in size. Body changes during your first trimester Your body goes through many changes during pregnancy. The changes vary and generally return to normal after your baby is born. Physical changes  You may gain or lose weight.  Your breasts may begin to grow larger and become tender. The tissue that surrounds your nipples (areola) may become darker.  Dark spots or blotches (chloasma or mask of pregnancy) may develop on your face.  You may have changes in your hair. These can include thickening or thinning of your hair or changes in texture. Health changes  You may feel nauseous, and you may vomit.  You may have heartburn.  You may develop headaches.  You may develop constipation.  Your gums may bleed and may be sensitive to brushing and flossing. Other changes  You may tire easily.  You may urinate more often.  Your menstrual periods will stop.  You may have a loss of appetite.  You may develop cravings for certain kinds of food.  You may have changes in your emotions from day to day.  You may have more vivid and strange dreams. Follow these instructions at home: Medicines  Follow your health care provider's instructions regarding medicine use. Specific medicines may be either safe or unsafe to take during pregnancy. Do not take any medicines unless told to by your health care provider.  Take a  prenatal vitamin that contains at least 600 micrograms (mcg) of folic acid. Eating and drinking  Eat a healthy diet that includes fresh fruits and vegetables, whole grains, good sources of protein such as meat, eggs, or tofu, and low-fat dairy products.  Avoid raw meat and unpasteurized juice, milk, and cheese. These carry germs that can harm you and your baby.  If you feel nauseous or you vomit: ? Eat 4 or 5 small meals a day instead of 3 large meals. ? Try eating a few soda crackers. ? Drink liquids between meals instead of during meals.  You may need to take these actions to prevent or treat constipation: ? Drink enough fluid to keep your urine pale yellow. ? Eat foods that are high in fiber, such as beans, whole grains, and fresh fruits and vegetables. ? Limit foods that are high in fat and processed sugars, such as fried or sweet foods. Activity  Exercise only as directed by your health care provider. Most people can continue their usual exercise routine during pregnancy. Try to exercise for 30 minutes at least 5 days a week.  Stop exercising if you develop pain or cramping in the lower abdomen or lower back.  Avoid exercising if it is very hot or humid or if you are at high altitude.  Avoid heavy lifting.  If you choose to, you may have sex unless your health care provider tells you not to. Relieving pain and discomfort  Wear a good support bra to relieve breast   tenderness.  Rest with your legs elevated if you have leg cramps or low back pain.  If you develop bulging veins (varicose veins) in your legs: ? Wear support hose as told by your health care provider. ? Elevate your feet for 15 minutes, 3-4 times a day. ? Limit salt in your diet. Safety  Wear your seat belt at all times when driving or riding in a car.  Talk with your health care provider if someone is verbally or physically abusive to you.  Talk with your health care provider if you are feeling sad or have  thoughts of hurting yourself. Lifestyle  Do not use hot tubs, steam rooms, or saunas.  Do not douche. Do not use tampons or scented sanitary pads.  Do not use herbal remedies, alcohol, illegal drugs, or medicines that are not approved by your health care provider. Chemicals in these products can harm your baby.  Do not use any products that contain nicotine or tobacco, such as cigarettes, e-cigarettes, and chewing tobacco. If you need help quitting, ask your health care provider.  Avoid cat litter boxes and soil used by cats. These carry germs that can cause birth defects in the baby and possibly loss of the unborn baby (fetus) by miscarriage or stillbirth. General instructions  During routine prenatal visits in the first trimester, your health care provider will do a physical exam, perform necessary tests, and ask you how things are going. Keep all follow-up visits. This is important.  Ask for help if you have counseling or nutritional needs during pregnancy. Your health care provider can offer advice or refer you to specialists for help with various needs.  Schedule a dentist appointment. At home, brush your teeth with a soft toothbrush. Floss gently.  Write down your questions. Take them to your prenatal visits. Where to find more information  American Pregnancy Association: americanpregnancy.org  American College of Obstetricians and Gynecologists: acog.org/en/Womens%20Health/Pregnancy  Office on Women's Health: womenshealth.gov/pregnancy Contact a health care provider if you have:  Dizziness.  A fever.  Mild pelvic cramps, pelvic pressure, or nagging pain in the abdominal area.  Nausea, vomiting, or diarrhea that lasts for 24 hours or longer.  A bad-smelling vaginal discharge.  Pain when you urinate.  Known exposure to a contagious illness, such as chickenpox, measles, Zika virus, HIV, or hepatitis. Get help right away if you have:  Spotting or bleeding from your  vagina.  Severe abdominal cramping or pain.  Shortness of breath or chest pain.  Any kind of trauma, such as from a fall or a car crash.  New or increased pain, swelling, or redness in an arm or leg. Summary  The first trimester of pregnancy starts on the first day of your last menstrual period until the end of week 12 (months 1 through 3).  Eating 4 or 5 small meals a day rather than 3 large meals may help to relieve nausea and vomiting.  Do not use any products that contain nicotine or tobacco, such as cigarettes, e-cigarettes, and chewing tobacco. If you need help quitting, ask your health care provider.  Keep all follow-up visits. This is important. This information is not intended to replace advice given to you by your health care provider. Make sure you discuss any questions you have with your health care provider. Document Revised: 02/21/2020 Document Reviewed: 12/28/2019 Elsevier Patient Education  2021 Elsevier Inc.  

## 2021-01-23 LAB — COMPREHENSIVE METABOLIC PANEL
ALT: 15 IU/L (ref 0–32)
AST: 14 IU/L (ref 0–40)
Albumin/Globulin Ratio: 1.5 (ref 1.2–2.2)
Albumin: 4 g/dL (ref 3.8–4.8)
Alkaline Phosphatase: 69 IU/L (ref 44–121)
BUN/Creatinine Ratio: 13 (ref 9–23)
BUN: 10 mg/dL (ref 6–24)
Bilirubin Total: <0.2 mg/dL (ref 0.0–1.2)
CO2: 19 mmol/L — ABNORMAL LOW (ref 20–29)
Calcium: 9.3 mg/dL (ref 8.7–10.2)
Chloride: 103 mmol/L (ref 96–106)
Creatinine, Ser: 0.77 mg/dL (ref 0.57–1.00)
Globulin, Total: 2.7 g/dL (ref 1.5–4.5)
Glucose: 117 mg/dL — ABNORMAL HIGH (ref 65–99)
Potassium: 4 mmol/L (ref 3.5–5.2)
Sodium: 136 mmol/L (ref 134–144)
Total Protein: 6.7 g/dL (ref 6.0–8.5)
eGFR: 99 mL/min/{1.73_m2} (ref >59)

## 2021-01-23 LAB — CBC/D/PLT+RPR+RH+ABO+RUB AB...
Antibody Screen: NEGATIVE
Basophils Absolute: 0.1 10*3/uL (ref 0.0–0.2)
Basos: 1 %
EOS (ABSOLUTE): 0.1 10*3/uL (ref 0.0–0.4)
Eos: 1 %
HCV Ab: <0.1 s/co ratio (ref 0.0–0.9)
HIV Screen 4th Generation wRfx: NONREACTIVE
Hematocrit: 41.5 % (ref 34.0–46.6)
Hemoglobin: 14 g/dL (ref 11.1–15.9)
Hepatitis B Surface Ag: NEGATIVE
Immature Grans (Abs): 0 10*3/uL (ref 0.0–0.1)
Immature Granulocytes: 0 %
Lymphocytes Absolute: 2.1 10*3/uL (ref 0.7–3.1)
Lymphs: 20 %
MCH: 30.7 pg (ref 26.6–33.0)
MCHC: 33.7 g/dL (ref 31.5–35.7)
MCV: 91 fL (ref 79–97)
Monocytes Absolute: 0.7 10*3/uL (ref 0.1–0.9)
Monocytes: 7 %
Neutrophils Absolute: 7.2 10*3/uL — ABNORMAL HIGH (ref 1.4–7.0)
Neutrophils: 71 %
Platelets: 242 10*3/uL (ref 150–450)
RBC: 4.56 x10E6/uL (ref 3.77–5.28)
RDW: 14 % (ref 11.7–15.4)
RPR Ser Ql: NONREACTIVE
Rh Factor: POSITIVE
Rubella Antibodies, IGG: 1.44 index (ref >0.99)
WBC: 10.2 10*3/uL (ref 3.4–10.8)

## 2021-01-23 LAB — CERVICOVAGINAL ANCILLARY ONLY
Chlamydia: NEGATIVE
Comment: NEGATIVE
Comment: NEGATIVE
Comment: NORMAL
Neisseria Gonorrhea: NEGATIVE
Trichomonas: NEGATIVE

## 2021-01-23 LAB — HCV INTERPRETATION

## 2021-01-23 LAB — TSH: TSH: 1.29 u[IU]/mL (ref 0.450–4.500)

## 2021-01-23 LAB — HEMOGLOBIN A1C
Est. average glucose Bld gHb Est-mCnc: 120 mg/dL
Hgb A1c MFr Bld: 5.8 % — ABNORMAL HIGH (ref 4.8–5.6)

## 2021-01-23 LAB — PROTEIN / CREATININE RATIO, URINE
Creatinine, Urine: 164.4 mg/dL
Protein, Ur: 8.4 mg/dL
Protein/Creat Ratio: 51 mg/g creat (ref 0–200)

## 2021-01-24 LAB — CULTURE, OB URINE

## 2021-01-24 LAB — URINE CULTURE, OB REFLEX

## 2021-01-27 ENCOUNTER — Encounter: Payer: Self-pay | Admitting: Obstetrics and Gynecology

## 2021-01-27 DIAGNOSIS — R87619 Unspecified abnormal cytological findings in specimens from cervix uteri: Secondary | ICD-10-CM | POA: Insufficient documentation

## 2021-01-27 LAB — CYTOLOGY - PAP
Comment: NEGATIVE
Comment: NEGATIVE
Diagnosis: UNDETERMINED — AB
HPV 16: POSITIVE — AB
HPV 18 / 45: NEGATIVE
High risk HPV: POSITIVE — AB

## 2021-01-28 ENCOUNTER — Encounter: Payer: Self-pay | Admitting: Obstetrics and Gynecology

## 2021-01-28 ENCOUNTER — Encounter: Payer: BC Managed Care – PPO | Admitting: Licensed Clinical Social Worker

## 2021-01-30 ENCOUNTER — Telehealth: Payer: Self-pay | Admitting: Family Medicine

## 2021-01-30 NOTE — Telephone Encounter (Signed)
Patient is due for PAP smear. Called and left generic voicemail message requesting to call our office or gyn (if established) to schedule an appoinemtent.      PCC---------when patient calls back please help schedule Annual/PAP with PCP.   Thanks,     Estefani Bateson LVN

## 2021-01-30 NOTE — Telephone Encounter (Signed)
General Advice / Message to MD:    FYI    Pt returned call. Pt stated she moved out of town and is no longer a pt of [facility].    Richard Sharen Counter

## 2021-02-03 ENCOUNTER — Telehealth: Payer: Self-pay | Admitting: Licensed Clinical Social Worker

## 2021-02-03 NOTE — Telephone Encounter (Signed)
FYI so you can update your list

## 2021-02-04 NOTE — Telephone Encounter (Signed)
Noted. Thanks,

## 2021-02-05 ENCOUNTER — Encounter: Payer: Self-pay | Admitting: Obstetrics and Gynecology

## 2021-02-16 ENCOUNTER — Ambulatory Visit (INDEPENDENT_AMBULATORY_CARE_PROVIDER_SITE_OTHER)
Admission: EM | Admit: 2021-02-16 | Discharge: 2021-02-16 | Disposition: A | Discharge status: Home / Self Care | Payer: BC Managed Care – PPO | Source: Home / Self Care

## 2021-02-16 ENCOUNTER — Other Ambulatory Visit: Payer: Self-pay

## 2021-02-16 ENCOUNTER — Encounter (HOSPITAL_COMMUNITY): Payer: Self-pay

## 2021-02-16 DIAGNOSIS — Z3A14 14 weeks gestation of pregnancy: Secondary | ICD-10-CM | POA: Insufficient documentation

## 2021-02-16 DIAGNOSIS — R81 Glycosuria: Secondary | ICD-10-CM | POA: Insufficient documentation

## 2021-02-16 DIAGNOSIS — R3915 Urgency of urination: Secondary | ICD-10-CM | POA: Diagnosis not present

## 2021-02-16 DIAGNOSIS — O2342 Unspecified infection of urinary tract in pregnancy, second trimester: Secondary | ICD-10-CM | POA: Diagnosis not present

## 2021-02-16 DIAGNOSIS — R350 Frequency of micturition: Secondary | ICD-10-CM

## 2021-02-16 DIAGNOSIS — O034 Incomplete spontaneous abortion without complication: Secondary | ICD-10-CM | POA: Diagnosis not present

## 2021-02-16 DIAGNOSIS — E162 Hypoglycemia, unspecified: Secondary | ICD-10-CM | POA: Insufficient documentation

## 2021-02-16 DIAGNOSIS — O2392 Unspecified genitourinary tract infection in pregnancy, second trimester: Secondary | ICD-10-CM | POA: Diagnosis not present

## 2021-02-16 LAB — POCT URINALYSIS DIPSTICK, ED / UC
Bilirubin Urine: NEGATIVE
Glucose, UA: 500 mg/dL — AB
Ketones, ur: 15 mg/dL — AB
Leukocytes,Ua: NEGATIVE
Nitrite: NEGATIVE
Protein, ur: NEGATIVE mg/dL
Specific Gravity, Urine: 1.025 (ref 1.005–1.030)
Urobilinogen, UA: 0.2 mg/dL (ref 0.0–1.0)
pH: 5.5 (ref 5.0–8.0)

## 2021-02-16 LAB — CBG MONITORING, ED
Glucose-Capillary: 59 mg/dL — ABNORMAL LOW (ref 70–99)
Glucose-Capillary: 85 mg/dL (ref 70–99)

## 2021-02-16 MED ORDER — GLUCOSE 4 G PO CHEW
CHEWABLE_TABLET | ORAL | Status: AC
  Filled 2021-02-16: qty 3

## 2021-02-16 MED ORDER — CEPHALEXIN 500 MG PO CAPS: 500.0000 mg | ORAL_CAPSULE | Freq: Three times a day (TID) | ORAL | 0 refills | Status: DC

## 2021-02-16 MED ORDER — GLUCOSE 4 G PO CHEW
3.0000 | CHEWABLE_TABLET | Freq: Once | ORAL | Status: AC
  Administered 2021-02-16: 12 g via ORAL

## 2021-02-16 NOTE — ED Provider Notes (Signed)
[facility]    CSN: [id number] Arrival date & time: 02/16/21  1217      History   Chief Complaint Chief Complaint  Patient presents with  . Urinary Frequency    HPI Christina Valdez is a 40 y.o. female.   Patient presents today with a 1 day history of urinary frequency.  Reports associated urinary urgency.  Denies dysuria, abdominal pain, pelvic pain, nausea, vomiting, fever.  She is currently [redacted] weeks pregnant and is unsure if urinary frequency is related to pregnancy or something else.  She denies any history of gestational diabetes or diabetes.  She does have family history of diabetes in her paternal grandmother.  She denies history of recurrent UTI, history of nephrolithiasis, recent urogenital procedure, history of single kidney, seeing a urologist in the past.  Denies any recent antibiotic use.     Past Medical History:  Diagnosis Date  . Hypertension     Patient Active Problem List   Diagnosis Date Noted  . Abnormal Pap smear of cervix 01/27/2021  . Chronic hypertension affecting pregnancy 01/22/2021  . Encounter for supervision of high risk primigravida of advanced maternal age, antepartum 01/15/2021    Past Surgical History:  Procedure Laterality Date  . HEMORRHOID SURGERY  2020  . UTERINE FIBROID SURGERY  2021    OB History    Gravida  1   Para      Term      Preterm      AB      Living        SAB      IAB      Ectopic      Multiple      Live Births               Home Medications    Prior to Admission medications   Medication Sig Start Date End Date Taking? Authorizing Provider  cephALEXin (KEFLEX) 500 MG capsule Take 1 capsule (500 mg total) by mouth 3 (three) times daily. 02/16/21  Yes Dyllin Gulley, Noberto Retort, PA-C  aspirin EC 81 MG tablet Take 1 tablet (81 mg total) by mouth daily. Take after 12 weeks for prevention of preeclampsia later in pregnancy 01/22/21   Hermina Staggers, MD  labetalol (NORMODYNE) 200 MG tablet Take 1  tablet (200 mg total) by mouth 2 (two) times daily. 01/22/21   Hermina Staggers, MD  Prenatal Vit-Fe Fumarate-FA (PRENATAL VITAMINS PO) Take by mouth.    [provider]    Family History Family History  Problem Relation Age of Onset  . Hypertension Mother   . Healthy Father   . Diabetes Paternal Grandmother     Social History Social History   Tobacco Use  . Smoking status: Never Smoker  . Smokeless tobacco: Never Used  Vaping Use  . Vaping Use: Never used  Substance Use Topics  . Alcohol use: Not Currently  . Drug use: Never     Allergies   Patient has no known allergies.   Review of Systems Review of Systems  Constitutional: Negative for activity change, appetite change, fatigue and fever.  Respiratory: Negative for cough and shortness of breath.   Cardiovascular: Negative for chest pain.  Gastrointestinal: Negative for abdominal pain, diarrhea, nausea and vomiting.  Genitourinary: Positive for frequency and urgency. Negative for dysuria, vaginal bleeding, vaginal discharge and vaginal pain.  Musculoskeletal: Negative for arthralgias, back pain and myalgias.  Neurological: Negative for dizziness, light-headedness and headaches.  Physical Exam Triage Vital Signs ED Triage Vitals  Enc Vitals Group     BP 02/16/21 1425 124/83     Pulse Rate 02/16/21 1425 77     Resp 02/16/21 1425 18     Temp 02/16/21 1425 98.7 F (37.1 C)     Temp Source 02/16/21 1425 Oral     SpO2 02/16/21 1425 98 %     Weight --      Height --      Head Circumference --      Peak Flow --      Pain Score 02/16/21 1423 0     Pain Loc --      Pain Edu? --      Excl. in GC? --    No data found.  Updated Vital Signs BP 124/83 (BP Location: Right Arm)   Pulse 77   Temp 98.7 F (37.1 C) (Oral)   Resp 18   LMP 11/04/2020   SpO2 98%   Visual Acuity Right Eye Distance:   Left Eye Distance:   Bilateral Distance:    Right Eye Near:   Left Eye Near:    Bilateral Near:      Physical Exam Vitals reviewed.  Constitutional:      General: She is awake. She is not in acute distress.    Appearance: Normal appearance. She is not ill-appearing.     Comments: Very pleasant female appears stated age in no acute distress  HENT:     Head: Normocephalic and atraumatic.     Mouth/Throat:     Pharynx: Uvula midline. No oropharyngeal exudate or posterior oropharyngeal erythema.  Cardiovascular:     Rate and Rhythm: Normal rate and regular rhythm.     Heart sounds: No murmur heard.   Pulmonary:     Effort: Pulmonary effort is normal.     Breath sounds: Normal breath sounds. No wheezing, rhonchi or rales.     Comments: Clear to auscultation bilaterally Abdominal:     General: Bowel sounds are normal.     Palpations: Abdomen is soft.     Tenderness: There is no abdominal tenderness. There is no right CVA tenderness, left CVA tenderness, guarding or rebound.     Comments: Benign abdominal exam  Genitourinary:    Comments: Exam deferred Musculoskeletal:     Cervical back: Normal range of motion and neck supple.  Psychiatric:        Behavior: Behavior is cooperative.      UC Treatments / Results  Labs (all labs ordered are listed, but only abnormal results are displayed) Labs Reviewed  POCT URINALYSIS DIPSTICK, ED / UC - Abnormal; Notable for the following components:      Result Value   Glucose, UA 500 (*)    Ketones, ur 15 (*)    Hgb urine dipstick TRACE (*)    All other components within normal limits  CBG MONITORING, ED - Abnormal; Notable for the following components:   Glucose-Capillary 59 (*)    All other components within normal limits  URINE CULTURE  CBG MONITORING, ED  CERVICOVAGINAL ANCILLARY ONLY    EKG   Radiology No results found.  Procedures Procedures (including critical care time)  Medications Ordered in UC Medications  glucose chewable tablet 12 g (12 g Oral Given 02/16/21 1515)    Initial Impression / Assessment and Plan  / UC Course  I have reviewed the triage vital signs and the nursing notes.  Pertinent labs & imaging results that  were available during my care of the patient were reviewed by me and considered in my medical decision making (see chart for details).     UA showed trace hemoglobin and glucosuria.  Patient denies history of diabetes.  Glucose was 59 and patient was given glucose tablets.  Repeat glucose was 85.  Will obtain urine culture and empirically treat for UTI given patient is pregnant.  STI swab was collected today we will make additional treatment recommendations based laboratory findings.  Discussed the importance of drinking plenty of fluid and eating small frequent meals throughout the day to avoid hypoglycemia.  She was instructed to follow-up with OB/GYN.  Strict return precautions given to which patient expressed understanding.  Final Clinical Impressions(s) / UC Diagnoses   Final diagnoses:  Urinary frequency  Urinary urgency  [redacted] weeks gestation of pregnancy  Hypoglycemia  Glucosuria     Discharge Instructions     Make sure you are eating small frequent meals in order to prevent low blood sugars.  I have called in Keflex to cover for urinary tract infection.  We sent your urine off for culture and we will contact you in 2 to 3 days with additional instructions if we need to change treatment.  We will contact you if additional treatment is necessary based on your swab.  Please follow-up with your OB/GYN.  If you have any worsening symptoms please return for reevaluation.    ED Prescriptions    Medication Sig Dispense Auth. Provider   cephALEXin (KEFLEX) 500 MG capsule Take 1 capsule (500 mg total) by mouth 3 (three) times daily. 21 capsule Caylynn Minchew K, PA-C     PDMP not reviewed this encounter.   Jeani Hawking, PA-C 02/16/21 1621

## 2021-02-16 NOTE — ED Triage Notes (Signed)
Pt reports urinary frequency since last night. Denies back pain, nausea.   Pt reports 14 weeks pregnancy.

## 2021-02-16 NOTE — Discharge Instructions (Addendum)
Make sure you are eating small frequent meals in order to prevent low blood sugars.  I have called in Keflex to cover for urinary tract infection.  We sent your urine off for culture and we will contact you in 2 to 3 days with additional instructions if we need to change treatment.  We will contact you if additional treatment is necessary based on your swab.  Please follow-up with your OB/GYN.  If you have any worsening symptoms please return for reevaluation.

## 2021-02-17 ENCOUNTER — Other Ambulatory Visit: Payer: Self-pay

## 2021-02-17 ENCOUNTER — Encounter (HOSPITAL_COMMUNITY): Payer: Self-pay

## 2021-02-17 ENCOUNTER — Inpatient Hospital Stay (HOSPITAL_COMMUNITY)
Admission: EM | Admit: 2021-02-17 | Discharge: 2021-02-18 | DRG: 770 | Disposition: A | Discharge status: Home / Self Care | Payer: BC Managed Care – PPO | Attending: Obstetrics & Gynecology | Admitting: Obstetrics & Gynecology

## 2021-02-17 DIAGNOSIS — O034 Incomplete spontaneous abortion without complication: Principal | ICD-10-CM | POA: Diagnosis present

## 2021-02-17 DIAGNOSIS — O021 Missed abortion: Secondary | ICD-10-CM | POA: Diagnosis present

## 2021-02-17 DIAGNOSIS — Z3A15 15 weeks gestation of pregnancy: Secondary | ICD-10-CM

## 2021-02-17 DIAGNOSIS — Z20822 Contact with and (suspected) exposure to covid-19: Secondary | ICD-10-CM | POA: Diagnosis present

## 2021-02-17 LAB — COMPREHENSIVE METABOLIC PANEL
ALT: 23 U/L (ref 0–44)
AST: 17 U/L (ref 15–41)
Albumin: 3.3 g/dL — ABNORMAL LOW (ref 3.5–5.0)
Alkaline Phosphatase: 67 U/L (ref 38–126)
Anion gap: 7 (ref 5–15)
BUN: 10 mg/dL (ref 6–20)
CO2: 21 mmol/L — ABNORMAL LOW (ref 22–32)
Calcium: 9.7 mg/dL (ref 8.9–10.3)
Chloride: 105 mmol/L (ref 98–111)
Creatinine, Ser: 0.78 mg/dL (ref 0.44–1.00)
GFR, Estimated: >60 mL/min (ref >60)
Glucose, Bld: 94 mg/dL (ref 70–99)
Potassium: 3.9 mmol/L (ref 3.5–5.1)
Sodium: 133 mmol/L — ABNORMAL LOW (ref 135–145)
Total Bilirubin: 0.5 mg/dL (ref 0.3–1.2)
Total Protein: 6.8 g/dL (ref 6.5–8.1)

## 2021-02-17 LAB — CBC WITH DIFFERENTIAL/PLATELET
Abs Immature Granulocytes: 0.08 10*3/uL — ABNORMAL HIGH (ref 0.00–0.07)
Basophils Absolute: 0.1 10*3/uL (ref 0.0–0.1)
Basophils Relative: 0 %
Eosinophils Absolute: 0.2 10*3/uL (ref 0.0–0.5)
Eosinophils Relative: 1 %
HCT: 39.1 % (ref 36.0–46.0)
Hemoglobin: 13.5 g/dL (ref 12.0–15.0)
Immature Granulocytes: 1 %
Lymphocytes Relative: 19 %
Lymphs Abs: 2.5 10*3/uL (ref 0.7–4.0)
MCH: 30.9 pg (ref 26.0–34.0)
MCHC: 34.5 g/dL (ref 30.0–36.0)
MCV: 89.5 fL (ref 80.0–100.0)
Monocytes Absolute: 1.1 10*3/uL — ABNORMAL HIGH (ref 0.1–1.0)
Monocytes Relative: 8 %
Neutro Abs: 9.7 10*3/uL — ABNORMAL HIGH (ref 1.7–7.7)
Neutrophils Relative %: 71 %
Platelets: 220 10*3/uL (ref 150–400)
RBC: 4.37 MIL/uL (ref 3.87–5.11)
RDW: 13.3 % (ref 11.5–15.5)
WBC: 13.6 10*3/uL — ABNORMAL HIGH (ref 4.0–10.5)
nRBC: 0 % (ref 0.0–0.2)

## 2021-02-17 LAB — CERVICOVAGINAL ANCILLARY ONLY
Bacterial Vaginitis (gardnerella): NEGATIVE
Candida Glabrata: NEGATIVE
Candida Vaginitis: NEGATIVE
Chlamydia: NEGATIVE
Comment: NEGATIVE
Comment: NEGATIVE
Comment: NEGATIVE
Comment: NEGATIVE
Comment: NEGATIVE
Comment: NORMAL
Neisseria Gonorrhea: NEGATIVE
Trichomonas: NEGATIVE

## 2021-02-17 LAB — ABO/RH: ABO/RH(D): B POS

## 2021-02-17 LAB — HCG, QUANTITATIVE, PREGNANCY: hCG, Beta Chain, Quant, S: 64363 m[IU]/mL — ABNORMAL HIGH (ref <5)

## 2021-02-17 MED ORDER — OXYCODONE-ACETAMINOPHEN 5-325 MG PO TABS
1.0000 | ORAL_TABLET | Freq: Four times a day (QID) | ORAL | Status: DC | PRN
  Administered 2021-02-18 (×2): 2 via ORAL
  Filled 2021-02-17 (×2): qty 2

## 2021-02-17 MED ORDER — MISOPROSTOL 200 MCG PO TABS
200.0000 ug | ORAL_TABLET | ORAL | Status: DC
  Administered 2021-02-17 – 2021-02-18 (×3): 200 ug via ORAL
  Filled 2021-02-17 (×3): qty 1

## 2021-02-17 MED ORDER — ACETAMINOPHEN 325 MG PO TABS
650.0000 mg | ORAL_TABLET | ORAL | Status: DC | PRN
  Administered 2021-02-17 – 2021-02-18 (×3): 650 mg via ORAL
  Filled 2021-02-17 (×4): qty 2

## 2021-02-17 MED ORDER — DOCUSATE SODIUM 100 MG PO CAPS: 100.0000 mg | ORAL_CAPSULE | Freq: Every day | ORAL | Status: DC

## 2021-02-17 MED ORDER — ZOLPIDEM TARTRATE 5 MG PO TABS: 5.0000 mg | ORAL_TABLET | Freq: Every evening | ORAL | Status: DC | PRN

## 2021-02-17 MED ORDER — LABETALOL HCL 200 MG PO TABS
200.0000 mg | ORAL_TABLET | Freq: Two times a day (BID) | ORAL | Status: DC
  Administered 2021-02-17 – 2021-02-18 (×2): 200 mg via ORAL
  Filled 2021-02-17 (×2): qty 1

## 2021-02-17 MED ORDER — CALCIUM CARBONATE ANTACID 500 MG PO CHEW: 2.0000 | CHEWABLE_TABLET | ORAL | Status: DC | PRN

## 2021-02-17 NOTE — Progress Notes (Signed)
RROB contacted by the ED RN regarding a 15 week patient with a foot presenting in the vagina.  RROB en route. Encountered MAU midwife providers at bedside. VE performed by Antony Odea, CNM, no foot noted to be found. Transfer of patient to MAU at this time per providers.  MAU charge RN notified.

## 2021-02-17 NOTE — MAU Provider Note (Addendum)
History     CSN: [id number]  Arrival date and time: 02/17/21 1945   Event Date/Time   First Provider Initiated Contact with Patient 02/17/21 2113      Chief Complaint  Patient presents with  . Threatened Miscarriage   HPI Christina Valdez is a 40 y.o. G1P0 at 63w0dwho [facility] foot in [facility] vagina earlier this evening. Patient states she felt a strange sensation in her vagina while showering. She used a mGeologist, engineeringto examine her perineum and visualized feet protruding from her vagina. She denies pain, vaginal bleeding, abdominal tenderness or fever. She has initiated prenatal care with [facility].  OB History    Gravida  1   Para      Term      Preterm      AB      Living        SAB      IAB      Ectopic      Multiple      Live Births              Past Medical History:  Diagnosis Date  . Hypertension     Past Surgical History:  [place] Date  [place]TER[place]  2021    Family History  Problem Relation Age of Onset  . Hypertension Mother   . Healthy Father   . Diabetes Paternal Grandmother     Social History   Tobacco Use  . Smoking status: Never Smoker  . Smokeless tobacco: Never Used  Vaping Use  . Vaping Use: Never used  Substance Use Topics  . Alcohol use: Not Currently  . Drug use: Never    Allergies: No Known Allergies  Medications Prior to Admission  Medication Sig Dispense Refill Last Dose  . labetalol (NORMODYNE) 200 MG tablet Take 1 tablet (200 mg total) by mouth 2 (two) times daily. 60 tablet 3 02/17/2021 at Unknown time  . Prenatal Vit-Fe Fumarate-FA (PRENATAL VITAMINS PO) Take by mouth.   02/17/2021 at Unknown time  . aspirin EC 81 MG tablet Take 1 tablet (81 mg total) by mouth daily. Take after 12 weeks for prevention of preeclampsia later in pregnancy 300 tablet 2   . cephALEXin (KEFLEX) 500 MG capsule Take 1 capsule (500 mg total) by mouth  3 (three) times daily. 21 capsule 0     Review of Systems  Gastrointestinal: Negative for abdominal pain.  Genitourinary: Negative for vaginal bleeding.  All other systems reviewed and are negative.  Physical Exam   Blood pressure (!) 156/104, pulse 87, temperature 98.8 F (37.1 C), temperature source Tympanic, resp. rate 20, last menstrual period 11/04/2020, SpO2 100 %.  Physical Exam Vitals and nursing note reviewed. Exam conducted with a chaperone present.  Constitutional:      Appearance: Normal appearance.  Cardiovascular:     Rate and Rhythm: Normal rate and regular rhythm.     Pulses: Normal pulses.     Heart sounds: Normal heart sounds.  Pulmonary:     Effort: Pulmonary effort is normal.     Breath sounds: Normal breath sounds.  Genitourinary:    Comments: Single fetal foot visible with complete insertion of speculum. Full speculum exam deferred to allow patient to wait for arrival of family member. Skin:    Capillary Refill: Capillary refill takes less than 2 seconds.  Neurological:     Mental  Status: She is alert and oriented to person, place, and time.     MAU Course  Procedures  --Patient met in [facility] by Claudie Revering, CNM and RROB RN. Care assumed of patient. No visible fetal parts with retraction of labia. Patient transferred to MAU by MAU RN.  --Patient met by Jeronimo Greaves, CNm shortly after arrival to MAU. Preemptive discussion of possible outcomes, sensations, outpatient assessment. --Fetal leg visible with complete insertion of speculum. Patient requests d/c of exam prior to opening speculum. Patient given time to process, discuss events of this evening with family. --CNM returned to bedside at 2115. Patient able to make contact with her sister, expected shortly at bedside. Will defer continuation of speculum exam and likely delivery until sister is at bedside unless patient experiences change in status before that time. --2150. Notified by MAU front desk  that patient's mother has arrived. Patient's sister is still preferred support person and has not yet arrived on campus. Patient verbalizes preference to wait for further intervention until her sister is at her bedside. Pain score remains 0/10, no bleeding reported or observed. --Anticipatory conversation with Dr. Elonda Husky. Patient appropriate for Cytotec following delivery to facilitate passing of placenta, products of conception   Report given to M. Jimmye Norman, CNM who assumes care of patient at this time.  Mallie Snooks, MSN, CNM Certified Nurse Midwife, [facility] for [employer], [employer] 02/17/21 9:58 PM   Assumed care 2240   Speculum exam            Clear fluid leaking from vagina            Fetal feet seen, cervix is approximately 3-4cm            Had patient try to push, no movement.            Discussed with Dr Elonda Husky Will admit for observation on OBSC unit Cytotec 282m q 4hrs Labetalol ordered (pt states never takes her evening dose)  Assessment and Plan  A:   Single IUP at 1103w0d     Inevitable SAB        ?incompetent cervix  P:  Admit for observation       Cytotec q4h        Analgesia prn       Labetalol, RN instructed to give evening dose tonight       Anticipate delivery        MD to follow  WiSeabron SpatesCNM

## 2021-02-17 NOTE — ED Notes (Signed)
Patient taken to room and quick, primary assessment of perineum completed. No visual of presenting parts or blood. [facility] nurses and Md transporting pt to [facility]

## 2021-02-17 NOTE — ED Provider Notes (Signed)
[facility] Provider Note   CSN: [id number] Arrival date & time: 02/17/21  1945     History Chief Complaint  Patient presents with  . Threatened Miscarriage    [name] is a 40 y.o. female.   40 year old female presents with concern for preterm labor.  Denies any bleeding at this time.  Has had some cramps.  She is approximately [redacted] weeks pregnant.        Past Medical History:  Diagnosis Date  . Hypertension     Patient Active Problem List   Diagnosis Date Noted  . Abnormal Pap smear of cervix 01/27/2021  . Chronic hypertension affecting pregnancy 01/22/2021  . Encounter for supervision of high risk primigravida of advanced maternal age, antepartum 01/15/2021    Past Surgical History:  Procedure Laterality Date  . HEMORRHOID SURGERY  2020  . UTERINE FIBROID SURGERY  2021     OB History    Gravida  1   Para      Term      Preterm      AB      Living        SAB      IAB      Ectopic      Multiple      Live Births              Family History  Problem Relation Age of Onset  . Hypertension Mother   . Healthy Father   . Diabetes Paternal Grandmother     Social History   Tobacco Use  . Smoking status: Never Smoker  . Smokeless tobacco: Never Used  Vaping Use  . Vaping Use: Never used  Substance Use Topics  . Alcohol use: Not Currently  . Drug use: Never    Home Medications Prior to Admission medications   Medication Sig Start Date End Date Taking? Authorizing Provider  aspirin EC 81 MG tablet Take 1 tablet (81 mg total) by mouth daily. Take after 12 weeks for prevention of preeclampsia later in pregnancy 01/22/21   Hermina Staggers, MD  cephALEXin (KEFLEX) 500 MG capsule Take 1 capsule (500 mg total) by mouth 3 (three) times daily. 02/16/21   Raspet, Noberto Retort, PA-C  labetalol (NORMODYNE) 200 MG tablet Take 1 tablet (200 mg total) by mouth 2 (two) times daily. 01/22/21   Hermina Staggers, MD  Prenatal  Vit-Fe Fumarate-FA (PRENATAL VITAMINS PO) Take by mouth.    [provider]    Allergies    Patient has no known allergies.  Review of Systems   Review of Systems  All other systems reviewed and are negative.   Physical Exam Updated Vital Signs BP (!) 156/104 (BP Location: Right Arm)   Pulse 87   Temp 98.8 F (37.1 C) (Tympanic)   Resp 20   LMP 11/04/2020   SpO2 100%   Physical Exam Vitals and nursing note reviewed.  Constitutional:      General: She is not in acute distress.    Appearance: Normal appearance. She is well-developed. She is not toxic-appearing.  HENT:     Head: Normocephalic and atraumatic.  Eyes:     General: Lids are normal.     Conjunctiva/sclera: Conjunctivae normal.     Pupils: Pupils are equal, round, and reactive to light.  Neck:     Thyroid: No thyroid mass.     Trachea: No tracheal deviation.  Cardiovascular:     Rate and Rhythm: Normal  rate and regular rhythm.     Heart sounds: Normal heart sounds. No murmur heard. No gallop.   Pulmonary:     Effort: Pulmonary effort is normal. No respiratory distress.     Breath sounds: Normal breath sounds. No stridor. No decreased breath sounds, wheezing, rhonchi or rales.  Abdominal:     General: Bowel sounds are normal. There is no distension.     Palpations: Abdomen is soft.     Tenderness: There is no abdominal tenderness. There is no rebound.  Genitourinary:    Comments: Patient is bimanual exam done by nurse midwife who states that there are no possible fetal parts, no bleeding noted. Musculoskeletal:        General: No tenderness. Normal range of motion.     Cervical back: Normal range of motion and neck supple.  Skin:    General: Skin is warm and dry.     Findings: No abrasion or rash.  Neurological:     Mental Status: She is alert and oriented to person, place, and time.     GCS: GCS eye subscore is 4. GCS verbal subscore is 5. GCS motor subscore is 6.     Cranial Nerves: No  cranial nerve deficit.     Sensory: No sensory deficit.  Psychiatric:        Speech: Speech normal.        Behavior: Behavior normal.     ED Results / Procedures / Treatments   Labs (all labs ordered are listed, but only abnormal results are displayed) Labs Reviewed  CBC WITH DIFFERENTIAL/PLATELET  COMPREHENSIVE METABOLIC PANEL  HCG, QUANTITATIVE, PREGNANCY  ABO/RH    EKG None  Radiology No results found.  Procedures Procedures   Medications Ordered in ED Medications - No data to display  ED Course  I have reviewed the triage vital signs and the nursing notes.  Pertinent labs & imaging results that were available during my care of the patient were reviewed by me and considered in my medical decision making (see chart for details).    MDM Rules/Calculators/A&P                          Patient to go to [facility] to complete her work-up Final Clinical Impression(s) / ED Diagnoses Final diagnoses:  None    Rx / DC Orders ED Discharge Orders    None       Lorre Nick, MD 02/17/21 2041

## 2021-02-17 NOTE — MAU Note (Signed)
Pt presents to MAU from the emergency department for threatened miscarriage.  Pt states she was in the shower earlier this evening and thought she saw a "foot" coming out of her vagina.  CNM aware, pelvic exam performed by S. Weinhold at the bedside.

## 2021-02-17 NOTE — ED Provider Notes (Signed)
Emergency Medicine Provider Triage Evaluation Note  Christina Valdez , a 40 y.o. female  was evaluated in triage.  She is about [redacted] weeks pregnant. Concern for miscarriage as she states she was showering and noticed a foot sticking out of her vagina. Denies vaginal bleeding or pain.  Review of Systems  Positive: ?miscarriage Negative: Vaginal bleeding  Physical Exam  BP (!) 155/94 (BP Location: Right Arm)   Pulse 79   Temp 98.2 F (36.8 C) (Oral)   Resp 18   LMP 11/04/2020   SpO2 100%  Gen:   Awake, no distress   Resp:  Normal effort  MSK:   Moves extremities without difficulty  Other:  Abdomen is soft  Medical Decision Making  Medically screening exam initiated at 8:15 PM.  Appropriate orders placed.  Christina Valdez was informed that the remainder of the evaluation will be completed by another provider, this initial triage assessment does not replace that evaluation, and the importance of remaining in the ED until their evaluation is complete.  Spoke to MAU provider but as this is concern for eminent delivery, will get to room in ED to be evaluated by rapid response OB. Patient sent to ED room.   Christina Pates, PA-C 02/17/21 2017    Christina Fines, MD 02/17/21 [phone]

## 2021-02-17 NOTE — ED Triage Notes (Signed)
Pt states that she is [redacted] weeks pregnant G1P0 and was taking a shower and seen a foot sticking out, denies vaginal bleeding

## 2021-02-18 ENCOUNTER — Inpatient Hospital Stay (HOSPITAL_COMMUNITY): Payer: BC Managed Care – PPO | Admitting: Anesthesiology

## 2021-02-18 ENCOUNTER — Other Ambulatory Visit: Payer: Self-pay | Admitting: Obstetrics & Gynecology

## 2021-02-18 ENCOUNTER — Encounter (HOSPITAL_COMMUNITY)
Admission: EM | Disposition: A | Discharge status: Home / Self Care | Payer: Self-pay | Source: Home / Self Care | Attending: Obstetrics & Gynecology

## 2021-02-18 ENCOUNTER — Encounter (HOSPITAL_COMMUNITY): Payer: Self-pay | Admitting: Obstetrics & Gynecology

## 2021-02-18 DIAGNOSIS — Z3A15 15 weeks gestation of pregnancy: Secondary | ICD-10-CM | POA: Diagnosis not present

## 2021-02-18 DIAGNOSIS — O034 Incomplete spontaneous abortion without complication: Principal | ICD-10-CM

## 2021-02-18 DIAGNOSIS — O021 Missed abortion: Secondary | ICD-10-CM | POA: Diagnosis present

## 2021-02-18 DIAGNOSIS — Z20822 Contact with and (suspected) exposure to covid-19: Secondary | ICD-10-CM | POA: Diagnosis not present

## 2021-02-18 HISTORY — PX: DILATION AND EVACUATION: SHX1459

## 2021-02-18 LAB — RESP PANEL BY RT-PCR (FLU A&B, COVID) ARPGX2
Influenza A by PCR: NEGATIVE
Influenza B by PCR: NEGATIVE
SARS Coronavirus 2 by RT PCR: NEGATIVE

## 2021-02-18 LAB — URINE CULTURE

## 2021-02-18 SURGERY — DILATION AND EVACUATION, UTERUS
Anesthesia: Monitor Anesthesia Care

## 2021-02-18 MED ORDER — MIDAZOLAM HCL 2 MG/2ML IJ SOLN
INTRAMUSCULAR | Status: DC | PRN
  Administered 2021-02-18: 1 mg via INTRAVENOUS

## 2021-02-18 MED ORDER — DEXAMETHASONE SODIUM PHOSPHATE 10 MG/ML IJ SOLN
INTRAMUSCULAR | Status: AC
  Filled 2021-02-18: qty 1

## 2021-02-18 MED ORDER — PROMETHAZINE HCL 25 MG/ML IJ SOLN: 6.2500 mg | INTRAMUSCULAR | Status: DC | PRN

## 2021-02-18 MED ORDER — FENTANYL CITRATE (PF) 250 MCG/5ML IJ SOLN
INTRAMUSCULAR | Status: AC
  Filled 2021-02-18: qty 5

## 2021-02-18 MED ORDER — BUPIVACAINE HCL (PF) 0.5 % IJ SOLN
INTRAMUSCULAR | Status: AC
  Filled 2021-02-18: qty 90

## 2021-02-18 MED ORDER — OXYCODONE-ACETAMINOPHEN 5-325 MG PO TABS: 1.0000 | ORAL_TABLET | Freq: Four times a day (QID) | ORAL | 0 refills | Status: DC | PRN

## 2021-02-18 MED ORDER — DOXYCYCLINE HYCLATE 100 MG IV SOLR
200.0000 mg | INTRAVENOUS | Status: AC
  Administered 2021-02-18: 200 mg via INTRAVENOUS
  Filled 2021-02-18: qty 200

## 2021-02-18 MED ORDER — PROPOFOL 10 MG/ML IV BOLUS
INTRAVENOUS | Status: DC | PRN
  Administered 2021-02-18 (×3): 20 mg via INTRAVENOUS

## 2021-02-18 MED ORDER — OXYTOCIN-SODIUM CHLORIDE 30-0.9 UT/500ML-% IV SOLN
INTRAVENOUS | Status: AC
  Filled 2021-02-18: qty 500

## 2021-02-18 MED ORDER — MIDAZOLAM HCL 2 MG/2ML IJ SOLN
INTRAMUSCULAR | Status: AC
  Filled 2021-02-18: qty 2

## 2021-02-18 MED ORDER — OXYTOCIN BOLUS FROM INFUSION
333.0000 mL | Freq: Once | INTRAVENOUS | Status: AC
Start: 2021-02-18 — End: 2021-02-18
  Administered 2021-02-18: 200 mL via INTRAVENOUS

## 2021-02-18 MED ORDER — ONDANSETRON HCL 4 MG/2ML IJ SOLN
INTRAMUSCULAR | Status: AC
  Filled 2021-02-18: qty 2

## 2021-02-18 MED ORDER — OXYCODONE HCL 5 MG PO TABS: 5.0000 mg | ORAL_TABLET | Freq: Once | ORAL | Status: DC | PRN

## 2021-02-18 MED ORDER — DEXAMETHASONE SODIUM PHOSPHATE 10 MG/ML IJ SOLN
INTRAMUSCULAR | Status: DC | PRN
  Administered 2021-02-18: 10 mg via INTRAVENOUS

## 2021-02-18 MED ORDER — PROPOFOL 10 MG/ML IV BOLUS
INTRAVENOUS | Status: AC
  Filled 2021-02-18: qty 20

## 2021-02-18 MED ORDER — OXYTOCIN-SODIUM CHLORIDE 30-0.9 UT/500ML-% IV SOLN
2.5000 [IU]/h | INTRAVENOUS | Status: DC
  Administered 2021-02-18: 10 [IU]/h via INTRAVENOUS
  Filled 2021-02-18: qty 500

## 2021-02-18 MED ORDER — KETOROLAC TROMETHAMINE 30 MG/ML IJ SOLN
INTRAMUSCULAR | Status: DC | PRN
  Administered 2021-02-18: 30 mg via INTRAVENOUS

## 2021-02-18 MED ORDER — BUPIVACAINE-EPINEPHRINE 0.5% -1:200000 IJ SOLN
INTRAMUSCULAR | Status: DC | PRN
  Administered 2021-02-18: 10 mL

## 2021-02-18 MED ORDER — POVIDONE-IODINE 10 % EX SWAB: 2.0000 "application " | Freq: Once | CUTANEOUS | Status: DC

## 2021-02-18 MED ORDER — FENTANYL CITRATE (PF) 100 MCG/2ML IJ SOLN: 25.0000 ug | INTRAMUSCULAR | Status: DC | PRN

## 2021-02-18 MED ORDER — ONDANSETRON HCL 4 MG/2ML IJ SOLN
INTRAMUSCULAR | Status: DC | PRN
  Administered 2021-02-18: 4 mg via INTRAVENOUS

## 2021-02-18 MED ORDER — KETOROLAC TROMETHAMINE 30 MG/ML IJ SOLN
INTRAMUSCULAR | Status: AC
  Filled 2021-02-18: qty 1

## 2021-02-18 MED ORDER — OXYCODONE HCL 5 MG/5ML PO SOLN: 5.0000 mg | Freq: Once | ORAL | Status: DC | PRN

## 2021-02-18 MED ORDER — LACTATED RINGERS IV SOLN: INTRAVENOUS | Status: DC

## 2021-02-18 MED ORDER — HYDROMORPHONE HCL 1 MG/ML IJ SOLN
1.0000 mg | INTRAMUSCULAR | Status: DC | PRN
  Administered 2021-02-18: 2 mg via INTRAVENOUS
  Filled 2021-02-18: qty 2

## 2021-02-18 MED ORDER — FENTANYL CITRATE (PF) 100 MCG/2ML IJ SOLN
INTRAMUSCULAR | Status: DC | PRN
  Administered 2021-02-18 (×2): 50 ug via INTRAVENOUS

## 2021-02-18 SURGICAL SUPPLY — 18 items
CATH ROBINSON RED A/P 16FR (CATHETERS) ×2 IMPLANT
CLOTH BEACON ORANGE TIMEOUT ST (SAFETY) ×2 IMPLANT
DECANTER SPIKE VIAL GLASS SM (MISCELLANEOUS) IMPLANT
GLOVE BIO SURGEON STRL SZ 6.5 (GLOVE) ×2 IMPLANT
GLOVE BIOGEL PI IND STRL 7.0 (GLOVE) ×2 IMPLANT
GLOVE BIOGEL PI INDICATOR 7.0 (GLOVE) ×2
GOWN STRL REUS W/TWL LRG LVL3 (GOWN DISPOSABLE) ×4 IMPLANT
KIT BERKELEY 1ST TRIMESTER 3/8 (MISCELLANEOUS) IMPLANT
NS IRRIG 1000ML POUR BTL (IV SOLUTION) ×2 IMPLANT
PACK VAGINAL MINOR WOMEN LF (CUSTOM PROCEDURE TRAY) ×2 IMPLANT
PAD OB MATERNITY 4.3X12.25 (PERSONAL CARE ITEMS) ×2 IMPLANT
PAD PREP 24X48 CUFFED NSTRL (MISCELLANEOUS) ×2 IMPLANT
SET BERKELEY SUCTION TUBING (SUCTIONS) IMPLANT
TOWEL OR 17X24 6PK STRL BLUE (TOWEL DISPOSABLE) ×4 IMPLANT
VACURETTE 10 RIGID CVD (CANNULA) IMPLANT
VACURETTE 7MM CVD STRL WRAP (CANNULA) IMPLANT
VACURETTE 8 RIGID CVD (CANNULA) IMPLANT
VACURETTE 9 RIGID CVD (CANNULA) IMPLANT

## 2021-02-18 NOTE — Op Note (Signed)
Christina Valdez PROCEDURE DATE: 02/18/2021  PREOPERATIVE DIAGNOSIS: 15 week incomplete abortion POSTOPERATIVE DIAGNOSIS: The same PROCEDURE:     Dilation and Evacuation SURGEON:  Scheryl Darter MD  INDICATIONS: 40 y.o. G1P0 with IAB at [redacted] weeks gestation, needing surgical completion.  Risks of surgery were discussed with the patient including but not limited to: bleeding which may require transfusion; infection which may require antibiotics; injury to uterus or surrounding organs; need for additional procedures including laparotomy or laparoscopy; possibility of intrauterine scarring which may impair future fertility; and other postoperative/anesthesia complications. Written informed consent was obtained.    FINDINGS:  A 8 week size uterus, moderate amounts of products of conception, specimen sent to pathology.  ANESTHESIA:    Monitored intravenous sedation, paracervical block. INTRAVENOUS FLUIDS:  1000 ml of LR ESTIMATED BLOOD LOSS:  Less than 50 ml. SPECIMENS:  Products of conception sent to pathology COMPLICATIONS:  None immediate.  PROCEDURE DETAILS:  The patient received intravenous Doxycycline while in the preoperative area.  She was then taken to the operating room where monitored intravenous sedation was administered and was found to be adequate.  After an adequate timeout was performed, she was placed in the dorsal lithotomy position and examined; then prepped and draped in the sterile manner.   Her bladder was catheterized for an unmeasured amount of clear, yellow urine. A vaginal speculum was then placed in the patient's vagina and a single tooth tenaculum was applied to the anterior lip of the cervix.  A paracervical block using 10 ml of 0.5% Marcaine was administered. The cervix was gently dilated to accommodate a 14 mm suction curette that was gently advanced to the uterine fundus.  The suction device was then activated and curette slowly rotated to clear the uterus of products of  conception.  A sharp curettage was then performed to confirm complete emptying of the uterus. There was minimal bleeding noted and the tenaculum removed with good hemostasis noted.   All instruments were removed from the patient's vagina.  Sponge and instrument counts were correct times two  The patient tolerated the procedure well and was taken to the recovery area awake, and in stable condition.  Adam Phenix, MD 02/18/2021 4:13 PM

## 2021-02-18 NOTE — Progress Notes (Signed)
Delivery of a nonviable stillborn fetus @1013 . Placenta remains intact. Pitocin infusing @200  ml/hr per provider's instructions. Will continue to monitor. , RN

## 2021-02-18 NOTE — Discharge Summary (Signed)
Physician Discharge Summary  Patient ID: Christina Valdez MRN: [id number] DOB/AGE: May 26, 1981 40 y.o.  Admit date: 02/17/2021 Discharge date: 02/18/2021  Admission Diagnoses:inevitable abortion at [redacted] weeks gestation  Discharge Diagnoses:  Active Problems:   Inevitable abortion   IUFD at less than 20 weeks of gestation   Discharged Condition: good  Hospital Course: Christina Valdez a39 y.o.G1P0at32w0dwho presents to MAU from [facility] with report of seeing a fetal foot in her vagina earlier this evening.Patient states she felt a strange sensation in her vagina while showering. She used a Ship broker to examine her perineum and visualized feet protruding from her vagina.She denies pain, vaginal bleeding, abdominal tenderness or fever. She has initiated prenatal care with [facility] She delivered the fetus intact at 1015 but the placenta was retained for several hours with pitocin given IV. Suction D&C was done without complication and she was discharged that evening Consults: None  Significant Diagnostic Studies: ultrasound  Treatments: surgery: D&E  Discharge Exam: Blood pressure 139/87, pulse 84, temperature 98.5 F (36.9 C), resp. rate 15, last menstrual period 11/04/2020, SpO2 98 %, unknown if currently breastfeeding. General appearance: alert, cooperative and no distress GI: soft, non-tender; bowel sounds normal; no masses,  no organomegaly  Disposition: Discharge disposition: 01-Home or Self Care       Discharge Instructions    Discharge patient   Complete by: As directed    Discharge disposition: 01-Home or Self Care   Discharge patient date: 02/18/2021     Allergies as of 02/18/2021   No Known Allergies     Medication List    STOP taking these medications   aspirin EC 81 MG tablet     TAKE these medications   cephALEXin 500 MG capsule Commonly known as: KEFLEX Take 1 capsule (500 mg total) by mouth 3 (three) times daily.   labetalol 200 MG tablet Commonly  known as: NORMODYNE Take 1 tablet (200 mg total) by mouth 2 (two) times daily.   oxyCODONE-acetaminophen 5-325 MG tablet Commonly known as: PERCOCET/ROXICET Take 1-2 tablets by mouth every 6 (six) hours as needed.   PRENATAL VITAMINS PO Take by mouth.       Follow-up Information    [facility] In 2 weeks.   Specialty: Obstetrics and Gynecology Contact information: [address] [place] [address] [phone]              Signed: Scheryl Darter 02/18/2021, 4:20 PM

## 2021-02-18 NOTE — Progress Notes (Signed)
Pt OOB and voiding without difficulty. Vaginal bleeding is minimal. Pt to be discharged home. Carmelina Dane, RN

## 2021-02-18 NOTE — H&P (Addendum)
Copy of MAU note ---> H&P History   CSN: [id number]  Arrival date and time: 02/17/21 1945   Event Date/Time   First Provider Initiated Contact with Patient 02/17/21 2113         Chief Complaint  Patient presents with  . Threatened Miscarriage   HPI Christina Valdez is a 40 y.o. G1P0 at 21w0dwho [facility]. Patient states she felt a strange sensation in her vagina while showering. She used a mGeologist, engineeringto examine her perineum and visualized feet protruding from her vagina. She denies pain, vaginal bleeding, abdominal tenderness or fever. She has initiated prenatal care with [facility].          OB History    Gravida  1   Para      Term      Preterm      AB      Living        SAB      IAB      Ectopic      Multiple      Live Births              Past Medical History:  Diagnosis Date  . Hypertension          Past Surgical History:  [place]  [place]  20[place]         Family History  Problem Relation Age of Onset  . Hypertension Mother   . Healthy Father   . Diabetes Paternal Grandmother     Social History       Tobacco Use  . Smoking status: Never Smoker  . Smokeless tobacco: Never Used  Vaping Use  . Vaping Use: Never used  Substance Use Topics  . Alcohol use: Not Currently  . Drug use: Never    Allergies: No Known Allergies         Medications Prior to Admission  Medication Sig Dispense Refill Last Dose  . labetalol (NORMODYNE) 200 MG tablet Take 1 tablet (200 mg total) by mouth 2 (two) times daily. 60 tablet 3 02/17/2021 at Unknown time  . Prenatal Vit-Fe Fumarate-FA (PRENATAL VITAMINS PO) Take by mouth.   02/17/2021 at Unknown time  . aspirin EC 81 MG tablet Take 1 tablet (81 mg total) by mouth daily. Take after 12 weeks for prevention of preeclampsia later in pregnancy  300 tablet 2   . cephALEXin (KEFLEX) 500 MG capsule Take 1 capsule (500 mg total) by mouth 3 (three) times daily. 21 capsule 0     Review of Systems  Gastrointestinal: Negative for abdominal pain.  Genitourinary: Negative for vaginal bleeding.  All other systems reviewed and are negative.  Physical Exam   Blood pressure (!) 156/104, pulse 87, temperature 98.8 F (37.1 C), temperature source Tympanic, resp. rate 20, last menstrual period 11/04/2020, SpO2 100 %.  Physical Exam Vitals and nursing note reviewed. Exam conducted with a chaperone present.  Constitutional:      Appearance: Normal appearance.  Cardiovascular:     Rate and Rhythm: Normal rate and regular rhythm.     Pulses: Normal pulses.     Heart sounds: Normal heart sounds.  Pulmonary:     Effort: Pulmonary effort is normal.     Breath sounds: Normal breath sounds.  Genitourinary:    Comments: Single fetal foot visible with complete insertion  of speculum. Full speculum exam deferred to allow patient to wait for arrival of family member. Skin:    Capillary Refill: Capillary refill takes less than 2 seconds.  Neurological:     Mental Status: She is alert and oriented to person, place, and time.     MAU Course  Procedures  --Patient met in [facility] by Claudie Revering, CNM and RROB RN. Care assumed of patient. No visible fetal parts with retraction of labia. Patient transferred to MAU by MAU RN.  --Patient met by Jeronimo Greaves, CNm shortly after arrival to MAU. Preemptive discussion of possible outcomes, sensations, outpatient assessment. --Fetal leg visible with complete insertion of speculum. Patient requests d/c of exam prior to opening speculum. Patient given time to process, discuss events of this evening with family. --CNM returned to bedside at 2115. Patient able to make contact with her sister, expected shortly at bedside. Will defer continuation of speculum exam and likely delivery until sister is at  bedside unless patient experiences change in status before that time. --2150. Notified by MAU front desk that patient's mother has arrived. Patient's sister is still preferred support person and has not yet arrived on campus. Patient verbalizes preference to wait for further intervention until her sister is at her bedside. Pain score remains 0/10, no bleeding reported or observed. --Anticipatory conversation with Dr. Elonda Husky. Patient appropriate for Cytotec following delivery to facilitate passing of placenta, products of conception   Report given to M. Jimmye Norman, CNM who assumes care of patient at this time.  Mallie Snooks, MSN, CNM Certified Nurse Midwife, [facility] for [employer], [employer] 02/17/21 9:58 PM   Assumed care 2240   Speculum exam            Clear fluid leaking from vagina            Fetal feet seen, cervix is approximately 3-4cm            Had patient try to push, no movement.            Discussed with Dr Elonda Husky Will admit for observation on OBSC unit Cytotec 266m q 4hrs Labetalol ordered (pt states never takes her evening dose)  Assessment and Plan  A:   Single IUP at 130w0d     Inevitable SAB        ?incompetent cervix but 15 weeks is very early for a primary cervical incompetence  She experienced gush of fluid Saturday night and had some thereafter, I think that was the trigger for this inevitable AB  P:  Admit for observation       Cytotec q4h        Analgesia prn       Labetalol, RN instructed to give evening dose tonight       Anticipate delivery        MD to follow  WiKennis Carina Attestation of Attending Supervision of Advanced Practitioner (CNM/NP/PA): Evaluation and management procedures were performed by the Advanced Practitioner under my supervision and collaboration. I have reviewed the Advanced Practitioner's note and chart, and I agree with the management and plan.  LuJacelyn GripMD Attending Physician for the Center for [facility]/24/2022 7:57 AM

## 2021-02-18 NOTE — Anesthesia Postprocedure Evaluation (Signed)
Anesthesia Post Note  Patient: PRESLY STEINRUCK  Procedure(s) Performed: DILATATION AND EVACUATION (N/A )     Patient location during evaluation: PACU Anesthesia Type: MAC Level of consciousness: awake and alert Pain management: pain level controlled Vital Signs Assessment: post-procedure vital signs reviewed and stable Respiratory status: spontaneous breathing, nonlabored ventilation and respiratory function stable Cardiovascular status: stable and blood pressure returned to baseline Anesthetic complications: no   No complications documented.  Last Vitals:  Vitals:   02/18/21 1654 02/18/21 1700  BP: (!) 144/81 (!) 145/87  Pulse: 75 77  Resp: (!) 22 18  Temp:  36.8 C  SpO2: 97%     Last Pain:  Vitals:   02/18/21 1700  TempSrc: Oral  PainSc:                  Audry Pili

## 2021-02-18 NOTE — Discharge Instructions (Signed)
Dilation and Curettage or Vacuum Curettage, Care After This sheet gives you information about how to care for yourself after your procedure. Your doctor may also give you more specific instructions. If you have problems or questions, contact your doctor. What can I expect after the procedure? After the procedure, it is common to have:  Mild pain or cramping.  Some bleeding or spotting from the vagina. These may last for up to 2 weeks. Follow these instructions at home: Medicines  Take over-the-counter and prescription medicines only as told by your doctor. This is very important if you take blood-thinning medicine.  Ask your doctor if the medicine prescribed to you requires you to avoid driving or using machinery. Activity  If you were given a medicine to help you relax (sedative) during your procedure, it can affect you for many hours. Do not drive or use machinery until your doctor says that it is safe.  Rest as told by your doctor.  Do not sit for a long time without moving. Get up to take short walks every 1-2 hours. This is important. Ask for help if you feel weak or unsteady.  Do not lift anything that is heavier than 10 lb (4.5 kg), or the limit that you are told, until your doctor says that it is safe.  Return to your normal activities as told by your doctor. Ask your doctor what activities are safe for you.   Lifestyle For at least 2 weeks, or as long as told by your doctor:  Do not douche.  Do not use tampons.  Do not have sex. General instructions  Wear compression stockings as told by your doctor.  It is up to you to get the results of your procedure. Ask your doctor, or the department that is doing the procedure, when your results will be ready.  Keep all follow-up visits as told by your doctor. This is important. Contact a doctor if:  You have very bad cramps that get worse or do not get better with medicine.  You have very bad pain in your belly  (abdomen).  You cannot drink fluids without vomiting.  You have pain in a different part of your pelvis. The pelvis is the area just above your thighs.  You have fluid from your vagina that smells bad.  You have a rash. Get help right away if:  You are bleeding a lot from your vagina. A lot of bleeding means soaking more than one sanitary pad in 1 hour for 2 hours in a row.  You have a fever that is above 100.4F (38.0C).  Your belly feels very tender or hard.  You have chest pain.  You have trouble breathing.  You feel dizzy.  You feel light-headed.  You pass out (faint).  You have pain in your neck or shoulder area. These symptoms may be an emergency. Do not wait to see if the symptoms will go away. Get medical help right away. Call your local emergency services (911 in the [place] Do not drive yourself to the hospital. Summary  After your procedure, it is common to have pain or cramping. It is also common to have bleeding or spotting from your vagina.  Rest as told. Do not sit for a long time without moving. Get up to take short walks every 1-2 hours.  Do not lift anything that is heavier than 10 lb (4.5 kg), or the limit that you are told.  Contact your doctor if you have fluid from   your vagina that smells bad.  Get help right away if you develop any problems from the procedure. Ask your doctor what problems to watch for. This information is not intended to replace advice given to you by your health care provider. Make sure you discuss any questions you have with your health care provider. Document Revised: 10/17/2019 Document Reviewed: 10/17/2019 Elsevier Patient Education  2021 Elsevier Inc.  

## 2021-02-18 NOTE — Anesthesia Preprocedure Evaluation (Addendum)
Anesthesia Evaluation  Patient identified by MRN, date of birth, ID band Patient awake    Reviewed: Allergy & Precautions, NPO status , Patient's Chart, lab work & pertinent test results, reviewed documented beta blocker date and time   History of Anesthesia Complications Negative for: history of anesthetic complications  Airway Mallampati: III  TM Distance: >3 FB Neck ROM: Full    Dental  (+) Dental Advisory Given   Pulmonary neg pulmonary ROS,    Pulmonary exam normal        Cardiovascular hypertension, Pt. on medications and Pt. on home beta blockers Normal cardiovascular exam     Neuro/Psych negative neurological ROS  negative psych ROS   GI/Hepatic negative GI ROS, Neg liver ROS,   Endo/Other  negative endocrine ROS  Renal/GU negative Renal ROS     Musculoskeletal negative musculoskeletal ROS (+)   Abdominal   Peds  Hematology negative hematology ROS (+)   Anesthesia Other Findings Covid test negative   Reproductive/Obstetrics  IUFD                             Anesthesia Physical Anesthesia Plan  ASA: II  Anesthesia Plan: MAC   Post-op Pain Management:    Induction: Intravenous  PONV Risk Score and Plan: 2 and Propofol infusion and Treatment may vary due to age or medical condition  Airway Management Planned: Natural Airway and Simple Face Mask  Additional Equipment: None  Intra-op Plan:   Post-operative Plan:   Informed Consent: I have reviewed the patients History and Physical, chart, labs and discussed the procedure including the risks, benefits and alternatives for the proposed anesthesia with the patient or authorized representative who has indicated his/her understanding and acceptance.       Plan Discussed with: CRNA and Anesthesiologist  Anesthesia Plan Comments:         Anesthesia Quick Evaluation

## 2021-02-18 NOTE — Transfer of Care (Signed)
Immediate Anesthesia Transfer of Care Note  Patient: Christina Valdez  Procedure(s) Performed: DILATATION AND EVACUATION (N/A )  Patient Location: PACU  Anesthesia Type:MAC  Level of Consciousness: drowsy  Airway & Oxygen Therapy: Patient Spontanous Breathing  Post-op Assessment: Report given to RN  Post vital signs: Reviewed and stable  Last Vitals:  Vitals Value Taken Time  BP 139/87 02/18/21 1600  Temp 36.9 C 02/18/21 1556  Pulse 78 02/18/21 1606  Resp 20 02/18/21 1606  SpO2 96 % 02/18/21 1606  Vitals shown include unvalidated device data.  Last Pain:  Vitals:   02/18/21 1556  TempSrc:   PainSc: 0-No pain         Complications: No complications documented.

## 2021-02-18 NOTE — Progress Notes (Signed)
Placenta is still retained and will require D&C. Patient desires surgical management .  The risks of surgery were discussed in detail with the patient including but not limited to: bleeding which may require transfusion or reoperation; infection which may require prolonged hospitalization or re-hospitalization and antibiotic therapy; injury to bowel, bladder, ureters and major vessels or other surrounding organs; formation of adhesions; need for additional procedures including laparotomy or subsequent procedures secondary to abnormal pathology; thromboembolic phenomenon; incisional problems and other postoperative or anesthesia complications.  Patient was told that the likelihood that her condition and symptoms will be treated effectively with this surgical management was very high; the postoperative expectations were also discussed in detail. The patient also understands the alternative treatment options which were discussed in full. All questions were answered.  OR has been called.  Adam Phenix, MD 02/18/2021 2:46 PM

## 2021-02-18 NOTE — Plan of Care (Signed)
Patient to be discharged home with printed instructions. Christina Valdez L Nozomi Mettler, RN  

## 2021-02-19 ENCOUNTER — Encounter: Payer: BC Managed Care – PPO | Admitting: Obstetrics and Gynecology

## 2021-02-20 ENCOUNTER — Encounter (HOSPITAL_COMMUNITY): Payer: Self-pay | Admitting: Obstetrics & Gynecology

## 2021-02-21 LAB — SURGICAL PATHOLOGY

## 2021-02-26 ENCOUNTER — Encounter: Payer: Self-pay | Admitting: Obstetrics and Gynecology

## 2021-02-26 ENCOUNTER — Ambulatory Visit (INDEPENDENT_AMBULATORY_CARE_PROVIDER_SITE_OTHER): Payer: BC Managed Care – PPO | Admitting: Obstetrics and Gynecology

## 2021-02-26 ENCOUNTER — Other Ambulatory Visit: Payer: Self-pay

## 2021-02-26 DIAGNOSIS — I10 Essential (primary) hypertension: Secondary | ICD-10-CM | POA: Diagnosis not present

## 2021-02-26 DIAGNOSIS — O034 Incomplete spontaneous abortion without complication: Secondary | ICD-10-CM | POA: Diagnosis not present

## 2021-02-26 DIAGNOSIS — Z8742 Personal history of other diseases of the female genital tract: Secondary | ICD-10-CM

## 2021-02-26 DIAGNOSIS — O021 Missed abortion: Secondary | ICD-10-CM

## 2021-02-26 MED ORDER — NIFEDIPINE ER OSMOTIC RELEASE 30 MG PO TB24: ORAL_TABLET | ORAL | 2 refills | Status: AC

## 2021-02-26 NOTE — Patient Instructions (Signed)
Health Maintenance, Female Adopting a healthy lifestyle and getting preventive care are important in promoting health and wellness. Ask your health care provider about:  The right schedule for you to have regular tests and exams.  Things you can do on your own to prevent diseases and keep yourself healthy. What should I know about diet, weight, and exercise? Eat a healthy diet  Eat a diet that includes plenty of vegetables, fruits, low-fat dairy products, and lean protein.  Do not eat a lot of foods that are high in solid fats, added sugars, or sodium.   Maintain a healthy weight Body mass index (BMI) is used to identify weight problems. It estimates body fat based on height and weight. Your health care provider can help determine your BMI and help you achieve or maintain a healthy weight. Get regular exercise Get regular exercise. This is one of the most important things you can do for your health. Most adults should:  Exercise for at least 150 minutes each week. The exercise should increase your heart rate and make you sweat (moderate-intensity exercise).  Do strengthening exercises at least twice a week. This is in addition to the moderate-intensity exercise.  Spend less time sitting. Even light physical activity can be beneficial. Watch cholesterol and blood lipids Have your blood tested for lipids and cholesterol at 42 years of age, then have this test every 5 years. Have your cholesterol levels checked more often if:  Your lipid or cholesterol levels are high.  You are older than 42 years of age.  You are at high risk for heart disease. What should I know about cancer screening? Depending on your health history and family history, you may need to have cancer screening at various ages. This may include screening for:  Breast cancer.  Cervical cancer.  Colorectal cancer.  Skin cancer.  Lung cancer. What should I know about heart disease, diabetes, and high blood  pressure? Blood pressure and heart disease  High blood pressure causes heart disease and increases the risk of stroke. This is more likely to develop in people who have high blood pressure readings, are of African descent, or are overweight.  Have your blood pressure checked: ? Every 3-5 years if you are 18-39 years of age. ? Every year if you are 40 years old or older. Diabetes Have regular diabetes screenings. This checks your fasting blood sugar level. Have the screening done:  Once every three years after age 40 if you are at a normal weight and have a low risk for diabetes.  More often and at a younger age if you are overweight or have a high risk for diabetes. What should I know about preventing infection? Hepatitis B If you have a higher risk for hepatitis B, you should be screened for this virus. Talk with your health care provider to find out if you are at risk for hepatitis B infection. Hepatitis C Testing is recommended for:  Everyone born from 1945 through 1965.  Anyone with known risk factors for hepatitis C. Sexually transmitted infections (STIs)  Get screened for STIs, including gonorrhea and chlamydia, if: ? You are sexually active and are younger than 42 years of age. ? You are older than 42 years of age and your health care provider tells you that you are at risk for this type of infection. ? Your sexual activity has changed since you were last screened, and you are at increased risk for chlamydia or gonorrhea. Ask your health care provider   if you are at risk.  Ask your health care provider about whether you are at high risk for HIV. Your health care provider may recommend a prescription medicine to help prevent HIV infection. If you choose to take medicine to prevent HIV, you should first get tested for HIV. You should then be tested every 3 months for as long as you are taking the medicine. Pregnancy  If you are about to stop having your period (premenopausal) and  you may become pregnant, seek counseling before you get pregnant.  Take 400 to 800 micrograms (mcg) of folic acid every day if you become pregnant.  Ask for birth control (contraception) if you want to prevent pregnancy. Osteoporosis and menopause Osteoporosis is a disease in which the bones lose minerals and strength with aging. This can result in bone fractures. If you are 65 years old or older, or if you are at risk for osteoporosis and fractures, ask your health care provider if you should:  Be screened for bone loss.  Take a calcium or vitamin D supplement to lower your risk of fractures.  Be given hormone replacement therapy (HRT) to treat symptoms of menopause. Follow these instructions at home: Lifestyle  Do not use any products that contain nicotine or tobacco, such as cigarettes, e-cigarettes, and chewing tobacco. If you need help quitting, ask your health care provider.  Do not use street drugs.  Do not share needles.  Ask your health care provider for help if you need support or information about quitting drugs. Alcohol use  Do not drink alcohol if: ? Your health care provider tells you not to drink. ? You are pregnant, may be pregnant, or are planning to become pregnant.  If you drink alcohol: ? Limit how much you use to 0-1 drink a day. ? Limit intake if you are breastfeeding.  Be aware of how much alcohol is in your drink. In the [place] one drink equals one 12 oz bottle of beer (355 mL), one 5 oz glass of wine (148 mL), or one 1 oz glass of hard liquor (44 mL). General instructions  Schedule regular health, dental, and eye exams.  Stay current with your vaccines.  Tell your health care provider if: ? You often feel depressed. ? You have ever been abused or do not feel safe at home. Summary  Adopting a healthy lifestyle and getting preventive care are important in promoting health and wellness.  Follow your health care provider's instructions about healthy  diet, exercising, and getting tested or screened for diseases.  Follow your health care provider's instructions on monitoring your cholesterol and blood pressure. This information is not intended to replace advice given to you by your health care provider. Make sure you discuss any questions you have with your health care provider. Document Revised: 09/07/2018 Document Reviewed: 09/07/2018 Elsevier Patient Education  2021 Elsevier Inc.  

## 2021-02-26 NOTE — Progress Notes (Signed)
    Post Partum Visit Note  Christina Valdez is a 40 y.o. G1P0 female who presents for a postpartum visit. She is 2 weeks postpartum following a SVD of FDIU at 15 weeks, plus D & C for retained POC. .  I have fully reviewed the prenatal and intrapartum course.   She reports her bleeding only spotting now. Denies any bowel or bladder dysfunction    Health Maintenance Due  Topic Date Due  . COVID-19 Vaccine (1) Never done  . URINE MICROALBUMIN  Never done    Medical records reviewed  Review of Systems Pertinent items are noted in HPI.  Objective:  BP (!) 150/93   Pulse 66   Wt 88.4 kg   LMP 11/04/2020   BMI 29.62 kg/m    General:  alert   Breasts:  not indicated  Lungs: clear to auscultation bilaterally  Heart:  regular rate and rhythm, S1, S2 normal, no murmur, click, rub or gallop  eAbdomen: soft, non-tender; bowel sounds normal; no masses,  no organomegaly   Wound NA  GU exam:  not indicated       Assessment:    1. History of cervical incompetence Events of FDIU and delivery reviewed with pt. Suspect pt has incompetent cervix. Dx reviewed with pt and recommendation of cercalge with any future pregnancies. Advised to reframe from pregnancy for at least 6 months. Pt declines contracpetion  2. Postpartum care following vaginal delivery Return to ADL's as tolerats  3. Chronic hypertension Will switch from Labetalol to Procardia. Pt has appt with PCP this Friday  F/U in 6 months for pap smear      Hermina Staggers, MD [facility], [facility]

## 2021-03-17 ENCOUNTER — Ambulatory Visit: Payer: BC Managed Care – PPO

## 2021-03-17 ENCOUNTER — Other Ambulatory Visit: Payer: BC Managed Care – PPO

## 2021-04-19 ENCOUNTER — Other Ambulatory Visit: Payer: Self-pay | Admitting: Obstetrics and Gynecology

## 2021-04-19 DIAGNOSIS — O10919 Unspecified pre-existing hypertension complicating pregnancy, unspecified trimester: Secondary | ICD-10-CM

## 2021-04-22 NOTE — Telephone Encounter (Signed)
Please contact Dr. Alysia Penna for approval.

## 2021-05-19 ENCOUNTER — Other Ambulatory Visit: Payer: Self-pay | Admitting: Obstetrics and Gynecology

## 2021-05-19 DIAGNOSIS — I10 Essential (primary) hypertension: Secondary | ICD-10-CM

## 2021-08-28 HISTORY — PX: COLONOSCOPY: SHX174

## 2021-11-24 ENCOUNTER — Other Ambulatory Visit: Payer: Self-pay

## 2021-11-24 ENCOUNTER — Encounter (HOSPITAL_BASED_OUTPATIENT_CLINIC_OR_DEPARTMENT_OTHER): Payer: Self-pay | Admitting: Orthopedic Surgery

## 2021-11-24 NOTE — Progress Notes (Signed)
Spoke w/ via phone for pre-op interview--- pt Lab needs dos---- istat, urine preg, ekg             Lab results------ no COVID test -----patient states asymptomatic no test needed Arrive at ------- 0615 on 11-27-2021 NPO after MN NO Solid Food.  Clear liquids from MN until--- 0615 Med rec completed Medications to take morning of surgery ----- procardia Diabetic medication ----- n/a Patient instructed no nail polish to be worn day of surgery Patient instructed to bring photo id and insurance card day of surgery Patient aware to have Driver (ride ) / caregiver for 24 hours after surgery --mother, armanda Patient Special Instructions ----- n/a Pre-Op special Istructions ----- pre-op orders pending Patient verbalized understanding of instructions that were given at this phone interview. Patient denies shortness of breath, chest pain, fever, cough at this phone interview.

## 2021-11-27 ENCOUNTER — Other Ambulatory Visit: Payer: Self-pay

## 2021-11-27 ENCOUNTER — Ambulatory Visit (HOSPITAL_BASED_OUTPATIENT_CLINIC_OR_DEPARTMENT_OTHER): Payer: BC Managed Care – PPO | Admitting: Certified Registered Nurse Anesthetist

## 2021-11-27 ENCOUNTER — Ambulatory Visit (HOSPITAL_BASED_OUTPATIENT_CLINIC_OR_DEPARTMENT_OTHER)
Admission: RE | Admit: 2021-11-27 | Discharge: 2021-11-27 | Disposition: A | Discharge status: Home / Self Care | Payer: BC Managed Care – PPO | Attending: Orthopedic Surgery | Admitting: Orthopedic Surgery

## 2021-11-27 ENCOUNTER — Encounter (HOSPITAL_BASED_OUTPATIENT_CLINIC_OR_DEPARTMENT_OTHER)
Admission: RE | Disposition: A | Discharge status: Home / Self Care | Payer: Self-pay | Source: Home / Self Care | Attending: Orthopedic Surgery

## 2021-11-27 ENCOUNTER — Encounter (HOSPITAL_BASED_OUTPATIENT_CLINIC_OR_DEPARTMENT_OTHER): Payer: Self-pay | Admitting: Orthopedic Surgery

## 2021-11-27 DIAGNOSIS — Z8616 Personal history of COVID-19: Secondary | ICD-10-CM | POA: Diagnosis not present

## 2021-11-27 DIAGNOSIS — I10 Essential (primary) hypertension: Secondary | ICD-10-CM | POA: Insufficient documentation

## 2021-11-27 DIAGNOSIS — M67432 Ganglion, left wrist: Secondary | ICD-10-CM | POA: Diagnosis present

## 2021-11-27 DIAGNOSIS — Z79899 Other long term (current) drug therapy: Secondary | ICD-10-CM | POA: Insufficient documentation

## 2021-11-27 HISTORY — DX: Personal history of other diseases of the female genital tract: Z87.42

## 2021-11-27 HISTORY — PX: GANGLION CYST EXCISION: SHX1691

## 2021-11-27 HISTORY — DX: Iron deficiency anemia, unspecified: D50.9

## 2021-11-27 HISTORY — DX: Ganglion, left wrist: M67.432

## 2021-11-27 HISTORY — DX: Female infertility, unspecified: N97.9

## 2021-11-27 LAB — POCT I-STAT, CHEM 8
BUN: 18 mg/dL (ref 6–20)
Calcium, Ion: 1.14 mmol/L — ABNORMAL LOW (ref 1.15–1.40)
Chloride: 107 mmol/L (ref 98–111)
Creatinine, Ser: 0.9 mg/dL (ref 0.44–1.00)
Glucose, Bld: 101 mg/dL — ABNORMAL HIGH (ref 70–99)
HCT: 42 % (ref 36.0–46.0)
Hemoglobin: 14.3 g/dL (ref 12.0–15.0)
Potassium: 3.9 mmol/L (ref 3.5–5.1)
Sodium: 139 mmol/L (ref 135–145)
TCO2: 23 mmol/L (ref 22–32)

## 2021-11-27 LAB — POCT PREGNANCY, URINE: Preg Test, Ur: NEGATIVE

## 2021-11-27 SURGERY — EXCISION, GANGLION CYST, WRIST
Anesthesia: Monitor Anesthesia Care | Site: Wrist | Laterality: Left

## 2021-11-27 MED ORDER — MIDAZOLAM HCL 5 MG/5ML IJ SOLN
INTRAMUSCULAR | Status: DC | PRN
  Administered 2021-11-27: 2 mg via INTRAVENOUS

## 2021-11-27 MED ORDER — KETAMINE HCL 50 MG/5ML IJ SOSY
PREFILLED_SYRINGE | INTRAMUSCULAR | Status: AC
  Filled 2021-11-27: qty 5

## 2021-11-27 MED ORDER — PROPOFOL 10 MG/ML IV BOLUS
INTRAVENOUS | Status: DC | PRN
  Administered 2021-11-27 (×5): 10 mg via INTRAVENOUS

## 2021-11-27 MED ORDER — LIDOCAINE HCL (CARDIAC) PF 100 MG/5ML IV SOSY
PREFILLED_SYRINGE | INTRAVENOUS | Status: DC | PRN
  Administered 2021-11-27: 60 mg via INTRAVENOUS

## 2021-11-27 MED ORDER — PROPOFOL 10 MG/ML IV BOLUS
INTRAVENOUS | Status: AC
  Filled 2021-11-27: qty 20

## 2021-11-27 MED ORDER — LIDOCAINE HCL (PF) 1 % IJ SOLN
INTRAMUSCULAR | Status: DC | PRN
  Administered 2021-11-27: 5 mL

## 2021-11-27 MED ORDER — FENTANYL CITRATE (PF) 100 MCG/2ML IJ SOLN
INTRAMUSCULAR | Status: AC
Start: 2021-11-27 — End: ?
  Filled 2021-11-27: qty 2

## 2021-11-27 MED ORDER — KETOROLAC TROMETHAMINE 30 MG/ML IJ SOLN
INTRAMUSCULAR | Status: DC | PRN
  Administered 2021-11-27: 30 mg via INTRAVENOUS

## 2021-11-27 MED ORDER — DEXAMETHASONE SODIUM PHOSPHATE 4 MG/ML IJ SOLN
INTRAMUSCULAR | Status: DC | PRN
  Administered 2021-11-27: 5 mg via INTRAVENOUS

## 2021-11-27 MED ORDER — ONDANSETRON HCL 4 MG/2ML IJ SOLN
INTRAMUSCULAR | Status: DC | PRN
  Administered 2021-11-27: 4 mg via INTRAVENOUS

## 2021-11-27 MED ORDER — BACITRACIN ZINC 500 UNIT/GM EX OINT
TOPICAL_OINTMENT | CUTANEOUS | Status: DC | PRN
  Administered 2021-11-27: 1 via TOPICAL

## 2021-11-27 MED ORDER — LACTATED RINGERS IV SOLN: INTRAVENOUS | Status: DC

## 2021-11-27 MED ORDER — MIDAZOLAM HCL 2 MG/2ML IJ SOLN
INTRAMUSCULAR | Status: AC
  Filled 2021-11-27: qty 2

## 2021-11-27 MED ORDER — KETAMINE HCL 10 MG/ML IJ SOLN
INTRAMUSCULAR | Status: DC | PRN
  Administered 2021-11-27: 15 mg via INTRAVENOUS

## 2021-11-27 MED ORDER — ONDANSETRON HCL 4 MG/2ML IJ SOLN
INTRAMUSCULAR | Status: AC
  Filled 2021-11-27: qty 2

## 2021-11-27 MED ORDER — BUPIVACAINE HCL (PF) 0.5 % IJ SOLN
INTRAMUSCULAR | Status: DC | PRN
Start: 2021-11-27 — End: 2021-11-27
  Administered 2021-11-27: 5 mL

## 2021-11-27 MED ORDER — OXYCODONE-ACETAMINOPHEN 5-325 MG PO TABS: 1.0000 | ORAL_TABLET | Freq: Four times a day (QID) | ORAL | 0 refills | Status: AC | PRN

## 2021-11-27 MED ORDER — KETOROLAC TROMETHAMINE 30 MG/ML IJ SOLN
INTRAMUSCULAR | Status: AC
  Filled 2021-11-27: qty 1

## 2021-11-27 SURGICAL SUPPLY — 32 items
BLADE SURG 15 STRL LF DISP TIS (BLADE) ×1 IMPLANT
BLADE SURG 15 STRL SS (BLADE) ×2
BNDG CMPR 9X4 STRL LF SNTH (GAUZE/BANDAGES/DRESSINGS) ×1
BNDG ELASTIC 4X5.8 VLCR STR LF (GAUZE/BANDAGES/DRESSINGS) ×2 IMPLANT
BNDG ESMARK 4X9 LF (GAUZE/BANDAGES/DRESSINGS) ×2 IMPLANT
CORD BIPOLAR FORCEPS 12FT (ELECTRODE) ×2 IMPLANT
COVER BACK TABLE 60X90IN (DRAPES) ×2 IMPLANT
CUFF TOURN SGL QUICK 24 (TOURNIQUET CUFF)
CUFF TRNQT CYL 24X4X16.5-23 (TOURNIQUET CUFF) IMPLANT
DRAPE EXTREMITY T 121X128X90 (DISPOSABLE) ×2 IMPLANT
DRAPE SHEET LG 3/4 BI-LAMINATE (DRAPES) ×2 IMPLANT
DRAPE SURG 17X23 STRL (DRAPES) ×2 IMPLANT
DRSG EMULSION OIL 3X3 NADH (GAUZE/BANDAGES/DRESSINGS) ×2 IMPLANT
GAUZE 4X4 16PLY ~~LOC~~+RFID DBL (SPONGE) ×2 IMPLANT
GAUZE SPONGE 4X4 12PLY STRL (GAUZE/BANDAGES/DRESSINGS) ×2 IMPLANT
GAUZE SPONGE 4X4 12PLY STRL LF (GAUZE/BANDAGES/DRESSINGS) ×1 IMPLANT
GLOVE SURG UNDER POLY LF SZ7.5 (GLOVE) ×2 IMPLANT
GOWN STRL REUS W/ TWL LRG LVL3 (GOWN DISPOSABLE) ×1 IMPLANT
GOWN STRL REUS W/TWL LRG LVL3 (GOWN DISPOSABLE) ×2
HIBICLENS CHG 4% 4OZ BTL (MISCELLANEOUS) ×2 IMPLANT
KIT TURNOVER CYSTO (KITS) ×2 IMPLANT
NEEDLE HYPO 22GX1.5 SAFETY (NEEDLE) ×2 IMPLANT
NS IRRIG 1000ML POUR BTL (IV SOLUTION) ×2 IMPLANT
PACK BASIN DAY SURGERY FS (CUSTOM PROCEDURE TRAY) ×2 IMPLANT
PAD CAST 4YDX4 CTTN HI CHSV (CAST SUPPLIES) ×1 IMPLANT
PADDING CAST COTTON 4X4 STRL (CAST SUPPLIES) ×2
SUT ETHILON 4 0 PS 2 18 (SUTURE) IMPLANT
SYR 10ML LL (SYRINGE) IMPLANT
SYR BULB EAR ULCER 3OZ GRN STR (SYRINGE) ×2 IMPLANT
TOWEL OR 17X26 10 PK STRL BLUE (TOWEL DISPOSABLE) ×2 IMPLANT
TRAY DSU PREP LF (CUSTOM PROCEDURE TRAY) ×2 IMPLANT
UNDERPAD 30X36 HEAVY ABSORB (UNDERPADS AND DIAPERS) ×2 IMPLANT

## 2021-11-27 NOTE — Op Note (Signed)
OPERATIVE NOTE ? ?DATE OF PROCEDURE: 11/27/2021 ? ?SURGEONS:  ?Primary: Orene Desanctis, MD ? ?PREOPERATIVE DIAGNOSIS: left dorsal carpal ganglion cyst ? ?POSTOPERATIVE DIAGNOSIS: Same ? ?NAME OF PROCEDURE:  ? ?Left Dorsal Carpal Ganglion Cyst Excision ? ?ANESTHESIA: Monitor Anesthesia Care + Local ? ?SKIN PREPARATION: Hibiclens ? ?ESTIMATED BLOOD LOSS: Minimal ? ?SPECIMEN: yes- sent to pathology ? ?INDICATIONS:  Christina Valdez is a 41 y.o. female who has the above preoperative diagnosis. The patient has decided to proceed with surgical intervention.  Risks, benefits and alternatives of operative management were discussed including, but not limited to, risks of anesthesia complications, infection, pain, persistent symptoms, stiffness, need for future surgery.  The patient understands, agrees and elects to proceed with surgery.   ? ?DESCRIPTION OF PROCEDURE: The patient was met in the pre-operative area and their identity was verified.  The operative location and laterality was also verified and marked.  The patient was brought to the OR and was placed supine on the table.  After repeat patient identification with the operative team anesthesia was provided and the patient was prepped and draped in the usual sterile fashion.  A final timeout was performed verifying the correction patient, procedure, location and laterality. ? ?Preoperative IV antibiotics were provided. The Left upper extremity was elevated and exsanguinated with an esmarch and tourniquet inflated to 250 mmHg. A longitudinal incision was made over the cyst and skin and subcutaneous tissues were divided. The cyst was encountered and stalk was identified down to the level of the SL joint. The cyst contents were drained, the stalk and entire cyst sac were excised. A dorsal capsulectomy was performed. Care was taken to protect the extensor tendons. The specimen was then sent to pathology. The wound was then irrigated with normal saline. Closure was performed with 4-0  nylon suture in horizontal mattress fashion. A sterile soft bandage was applied. The tourniquet was deflated and fingers pink and warm and well perfused. All counts were correct x 2. The patient tolerated the procedure well, was awoken from anesthesia and brought to PACU for recovery in stable condition. ? ? ?Matt Holmes, MD  ?

## 2021-11-27 NOTE — H&P (Signed)
Preoperative History & Physical Exam ? ?Surgeon: Matt Holmes, MD ? ?Diagnosis: left dorsal carpal ganglion cyst ? ?Planned Procedure: Procedure(s) (LRB): ?left dorsal carpal ganglion cyst excision (Left) ? ?History of Present Illness:   ?Patient is a 41 y.o. female with symptoms consistent with left dorsal carpal ganglion cyst who presents for surgical intervention. The risks, benefits and alternatives of surgical intervention were discussed and informed consent was obtained prior to surgery. ? ?Past Medical History:  ?Past Medical History:  ?Diagnosis Date  ? Ganglion cyst of wrist, left   ? History of cervical incompetence   ? History of COVID-19 09/2020  ? per pt mild symptoms that resolved  ? Hypertension   ? IDA (iron deficiency anemia)   ? Infertility, female   ? ? ?Past Surgical History:  ?Past Surgical History:  ?Procedure Laterality Date  ? COLONOSCOPY  08/2021  ? DILATION AND EVACUATION N/A 02/18/2021  ? Procedure: DILATATION AND EVACUATION;  Surgeon: Woodroe Mode, MD;  Location: [facility] LD ORS;  Service: Gynecology;  Laterality: N/A;  ? EXCISIONAL HEMORRHOIDECTOMY  09/12/2019  ? in [place]  ? HYSTEROSCOPY WITH RESECTOSCOPE  2021  ? myomectomy  ? ? ?Medications:  ?Prior to Admission medications   ?Medication Sig Start Date End Date Taking? Authorizing Provider  ?ferrous sulfate 220 (44 Fe) MG/5ML solution Take 10 mLs by mouth daily. 03/03/20  Yes [provider]  ?NIFEdipine (PROCARDIA-XL/NIFEDICAL-XL) 30 MG 24 hr tablet 1 tablet by mount daily ?Patient taking differently: 30 mg daily. 1 tablet by mount daily 02/26/21  Yes Chancy Milroy, MD  ?Prenatal Vit-Fe Fumarate-FA (PRENATAL VITAMINS PO) Take by mouth daily.   Yes [provider]  ? ? ?Allergies:  Patient has no known allergies. ? ?Review of Systems: Negative except per HPI. ? ?Physical Exam: ?Alert and oriented, NAD ?Head and neck: no masses, normal alignment ?CV: pulse intact ?Pulm: no increased work of breathing, respirations even  and unlabored ?Abdomen: non-distended ?Extremities: extremities warm and well perfused ? ?LABS: ?Recent Results (from the past 2160 hour(s))  ?Pregnancy, urine POC     Status: None  ? Collection Time: 11/27/21  6:35 AM  ?Result Value Ref Range  ? Preg Test, Ur NEGATIVE NEGATIVE  ?  Comment:        ?THE SENSITIVITY OF THIS ?METHODOLOGY IS >24 mIU/mL ?  ?I-STAT, chem 8     Status: Abnormal  ? Collection Time: 11/27/21  7:01 AM  ?Result Value Ref Range  ? Sodium 139 135 - 145 mmol/L  ? Potassium 3.9 3.5 - 5.1 mmol/L  ? Chloride 107 98 - 111 mmol/L  ? BUN 18 6 - 20 mg/dL  ? Creatinine, Ser 0.90 0.44 - 1.00 mg/dL  ? Glucose, Bld 101 (H) 70 - 99 mg/dL  ?  Comment: Glucose reference range applies only to samples taken after fasting for at least 8 hours.  ? Calcium, Ion 1.14 (L) 1.15 - 1.40 mmol/L  ? TCO2 23 22 - 32 mmol/L  ? Hemoglobin 14.3 12.0 - 15.0 g/dL  ? HCT 42.0 36.0 - 46.0 %  ?  ? ?Complete History and Physical exam available in the office notes ? ?Orene Desanctis ? ?

## 2021-11-27 NOTE — Transfer of Care (Signed)
Immediate Anesthesia Transfer of Care Note ? ?Patient: Christina Valdez ? ?Procedure(s) Performed: left dorsal carpal ganglion cyst excision (Left: Wrist) ? ?Patient Location: PACU ? ?Anesthesia Type:MAC ? ?Level of Consciousness: awake, alert  and oriented ? ?Airway & Oxygen Therapy: Patient Spontanous Breathing and Patient connected to nasal cannula oxygen ? ?Post-op Assessment: Report given to RN and Post -op Vital signs reviewed and stable ? ?Post vital signs: Reviewed and stable ? ?Last Vitals:  ?Vitals Value Taken Time  ?BP 129/96 11/27/21 0842  ?Temp    ?Pulse 61 11/27/21 0842  ?Resp 14 11/27/21 0842  ?SpO2 98 % 11/27/21 0842  ? ? ?Last Pain:  ?Vitals:  ? 11/27/21 0646  ?TempSrc: Oral  ?PainSc: 0-No pain  ?   ? ?Patients Stated Pain Goal: 6 (11/27/21 5217) ? ?Complications: No notable events documented. ?

## 2021-11-27 NOTE — Anesthesia Preprocedure Evaluation (Signed)
Anesthesia Evaluation  ?Patient identified by MRN, date of birth, ID band ?Patient awake ? ? ? ?Reviewed: ?Allergy & Precautions, NPO status , Patient's Chart, lab work & pertinent test results ? ?Airway ?Mallampati: II ? ?TM Distance: >3 FB ?Neck ROM: Full ? ? ? Dental ? ?(+) Teeth Intact, Dental Advisory Given ?  ?Pulmonary ?neg pulmonary ROS,  ?  ?breath sounds clear to auscultation ? ? ? ? ? ? Cardiovascular ?hypertension,  ?Rhythm:Regular Rate:Normal ? ? ?  ?Neuro/Psych ?negative neurological ROS ? negative psych ROS  ? GI/Hepatic ?Neg liver ROS,   ?Endo/Other  ?negative endocrine ROS ? Renal/GU ?negative Renal ROS  ? ?  ?Musculoskeletal ?negative musculoskeletal ROS ?(+)  ? Abdominal ?Normal abdominal exam  (+)   ?Peds ? Hematology ?negative hematology ROS ?(+)   ?Anesthesia Other Findings ? ? Reproductive/Obstetrics ? ?  ? ? ? ? ? ? ? ? ? ? ? ? ? ?  ?  ? ? ? ? ? ? ? ? ?Anesthesia Physical ?Anesthesia Plan ? ?ASA: 2 ? ?Anesthesia Plan: MAC  ? ?Post-op Pain Management:   ? ?Induction: Intravenous ? ?PONV Risk Score and Plan: 3 and Ondansetron, Propofol infusion and Midazolam ? ?Airway Management Planned: Natural Airway and Simple Face Mask ? ?Additional Equipment: None ? ?Intra-op Plan:  ? ?Post-operative Plan:  ? ?Informed Consent: I have reviewed the patients History and Physical, chart, labs and discussed the procedure including the risks, benefits and alternatives for the proposed anesthesia with the patient or authorized representative who has indicated his/her understanding and acceptance.  ? ? ? ? ? ?Plan Discussed with: CRNA ? ?Anesthesia Plan Comments:   ? ? ? ? ? ? ?Anesthesia Quick Evaluation ? ?

## 2021-11-27 NOTE — Discharge Instructions (Addendum)
  Orthopaedic Hand Surgery Discharge Instructions  WEIGHT BEARING STATUS: Non weight bearing on operative extremity  DRESSING CARE: Please keep your dressing/splint/cast clean and dry until your follow-up appointment. You may shower by placing a waterproof covering over your dressing/splint/cast. Contact your surgeon if your splint/cast gets wet. It will need to be changed to prevent skin breakdown.  PAIN CONTROL: First line medications for post operative pain control are Tylenol (acetaminophen) and Motrin (ibuprofen) if you are able to take these medications. If you have been prescribed a medication these can be taken as breakthrough pain medications. Please note that some narcotic pain medication has acetaminophen added and you should never consume more than 4,000mg of acetaminophen in 24-hour period. Please note that if you are given Toradol (ketorolac) you should not take similar medications such as ibuprofen or naproxen.  DISCHARGE MEDICATIONS: If you have been prescribed medication it was sent electronically to your pharmacy. No changes have been made to your home medications.  ICE/ELEVATION: Ice and elevate your injured extremity as needed. Avoid direct contact of ice with skin.   BANDAGE FEELS TOO TIGHT: If your bandage feels too tight, first make sure you are elevating your fingers as much as possible. The outer layer of the bandage can be unwrapped and reapplied more loosely. If no improvement, you may carefully cut the inner layer longitudinally until the pressure has resolved and then rewrap the outer layer. If you are not comfortable with these instructions, please call the office and the bandage can be changed for you.   FOLLOW UP: You will be called after surgery with an appointment date and time, however if you have not received a phone call within 3 days, please call during regular office hours at [phone] to schedule a post operative appointment.  Please Seek Medical Attention  if: Call MD for: pain or pressure in chest, jaw, arm, back, neck  Call MD for: temperature greater than 101 F for more than 24 hrs Call MD for: difficulty breathing Call MD for: incision redness, bleeding, drainage  Call MD for: palpitations or feeling that the heart is racing  Call MD for: increased swelling in arm, leg, ankle, or abdomen  Call MD for: lightheadedness, dizziness, fainting Call 911 or go to ER for any medical emergency if you are not able to get in touch with your doctor   J. Reid Spears, MD Orthopaedic Hand Surgeon [facility] Office number: [phone] [address] [place], [place] [address]  Post Anesthesia Home Care Instructions  Activity: Get plenty of rest for the remainder of the day. A responsible individual must stay with you for 24 hours following the procedure.  For the next 24 hours, DO NOT: -Drive a car -Operate machinery -Drink alcoholic beverages -Take any medication unless instructed by your physician -Make any legal decisions or sign important papers.  Meals: Start with liquid foods such as gelatin or soup. Progress to regular foods as tolerated. Avoid greasy, spicy, heavy foods. If nausea and/or vomiting occur, drink only clear liquids until the nausea and/or vomiting subsides. Call your physician if vomiting continues.  Special Instructions/Symptoms: Your throat may feel dry or sore from the anesthesia or the breathing tube placed in your throat during surgery. If this causes discomfort, gargle with warm salt water. The discomfort should disappear within 24 hours.      

## 2021-11-27 NOTE — Anesthesia Postprocedure Evaluation (Signed)
Anesthesia Post Note ? ?Patient: Christina Valdez ? ?Procedure(s) Performed: left dorsal carpal ganglion cyst excision (Left: Wrist) ? ?  ? ?Patient location during evaluation: PACU ?Anesthesia Type: MAC ?Level of consciousness: awake and alert ?Pain management: pain level controlled ?Vital Signs Assessment: post-procedure vital signs reviewed and stable ?Respiratory status: spontaneous breathing, nonlabored ventilation, respiratory function stable and patient connected to nasal cannula oxygen ?Cardiovascular status: stable and blood pressure returned to baseline ?Postop Assessment: no apparent nausea or vomiting ?Anesthetic complications: no ? ? ?No notable events documented. ? ?Last Vitals:  ?Vitals:  ? 11/27/21 0845 11/27/21 0857  ?BP: (!) 129/96 126/88  ?Pulse: 60 62  ?Resp:    ?Temp:    ?SpO2: 99% 99%  ?  ?Last Pain:  ?Vitals:  ? 11/27/21 0900  ?TempSrc:   ?PainSc: 0-No pain  ? ? ?  ?  ?  ?  ?  ?  ? ?Effie Berkshire ? ? ? ? ?

## 2021-11-28 ENCOUNTER — Encounter (HOSPITAL_BASED_OUTPATIENT_CLINIC_OR_DEPARTMENT_OTHER): Payer: Self-pay | Admitting: Orthopedic Surgery

## 2021-11-28 LAB — SURGICAL PATHOLOGY

## 2022-03-06 ENCOUNTER — Emergency Department (HOSPITAL_COMMUNITY): Payer: BC Managed Care – PPO

## 2022-03-06 ENCOUNTER — Other Ambulatory Visit: Payer: Self-pay

## 2022-03-06 ENCOUNTER — Emergency Department (HOSPITAL_COMMUNITY)
Admission: EM | Admit: 2022-03-06 | Discharge: 2022-03-07 | Disposition: A | Discharge status: Home / Self Care | Payer: BC Managed Care – PPO | Attending: Emergency Medicine | Admitting: Emergency Medicine

## 2022-03-06 DIAGNOSIS — R000 Tachycardia, unspecified: Secondary | ICD-10-CM | POA: Insufficient documentation

## 2022-03-06 DIAGNOSIS — R110 Nausea: Secondary | ICD-10-CM | POA: Insufficient documentation

## 2022-03-06 DIAGNOSIS — R0602 Shortness of breath: Secondary | ICD-10-CM | POA: Diagnosis not present

## 2022-03-06 DIAGNOSIS — R7989 Other specified abnormal findings of blood chemistry: Secondary | ICD-10-CM | POA: Insufficient documentation

## 2022-03-06 DIAGNOSIS — I10 Essential (primary) hypertension: Secondary | ICD-10-CM | POA: Diagnosis not present

## 2022-03-06 DIAGNOSIS — R0609 Other forms of dyspnea: Secondary | ICD-10-CM | POA: Diagnosis not present

## 2022-03-06 LAB — BASIC METABOLIC PANEL
Anion gap: 13 (ref 5–15)
BUN: 16 mg/dL (ref 6–20)
CO2: 16 mmol/L — ABNORMAL LOW (ref 22–32)
Calcium: 8.2 mg/dL — ABNORMAL LOW (ref 8.9–10.3)
Chloride: 108 mmol/L (ref 98–111)
Creatinine, Ser: 1.41 mg/dL — ABNORMAL HIGH (ref 0.44–1.00)
GFR, Estimated: 48 mL/min — ABNORMAL LOW (ref >60)
Glucose, Bld: 169 mg/dL — ABNORMAL HIGH (ref 70–99)
Potassium: 3.5 mmol/L (ref 3.5–5.1)
Sodium: 137 mmol/L (ref 135–145)

## 2022-03-06 LAB — CBC
HCT: 38.2 % (ref 36.0–46.0)
Hemoglobin: 12.6 g/dL (ref 12.0–15.0)
MCH: 28.6 pg (ref 26.0–34.0)
MCHC: 33 g/dL (ref 30.0–36.0)
MCV: 86.6 fL (ref 80.0–100.0)
Platelets: 262 10*3/uL (ref 150–400)
RBC: 4.41 MIL/uL (ref 3.87–5.11)
RDW: 14 % (ref 11.5–15.5)
WBC: 9.8 10*3/uL (ref 4.0–10.5)
nRBC: 0 % (ref 0.0–0.2)

## 2022-03-06 LAB — I-STAT BETA HCG BLOOD, ED (MC, WL, AP ONLY): I-stat hCG, quantitative: <5 m[IU]/mL (ref <5)

## 2022-03-06 LAB — TROPONIN I (HIGH SENSITIVITY)
Troponin I (High Sensitivity): 4 ng/L (ref <18)
Troponin I (High Sensitivity): 10 ng/L (ref <18)

## 2022-03-06 MED ORDER — ADENOSINE 6 MG/2ML IV SOLN
12.0000 mg | Freq: Once | INTRAVENOUS | Status: DC
Start: 2022-03-06 — End: 2022-03-06

## 2022-03-06 MED ORDER — ONDANSETRON HCL 4 MG/2ML IJ SOLN
4.0000 mg | Freq: Once | INTRAMUSCULAR | Status: AC
  Administered 2022-03-06: 4 mg via INTRAVENOUS
  Filled 2022-03-06: qty 2

## 2022-03-06 MED ORDER — SODIUM CHLORIDE 0.9 % IV BOLUS
1000.0000 mL | Freq: Once | INTRAVENOUS | Status: AC
  Administered 2022-03-06: 1000 mL via INTRAVENOUS

## 2022-03-06 NOTE — ED Notes (Signed)
EDP notified of PT's HR and monitor reading SVT. Pt converted back to sinus tachycardia and VSS

## 2022-03-06 NOTE — ED Notes (Signed)
Patient transported to CT 

## 2022-03-06 NOTE — ED Triage Notes (Signed)
Pt BIB from EMS for Tachycardia up to 148 but sustaining at 120-130. Pt felt lightheaded and dizzy with a near syncopal episode while working at home and felt panicky. Vagal maneuvers were unsuccessful with EMS and pt given 500cc NS    Hx HTN   170/90 HR 120-158

## 2022-03-06 NOTE — Consult Note (Signed)
Cardiology Consultation:   Patient ID: Christina Valdez MRN: [id number]; DOB: May 08, 1981  Admit date: 03/06/2022 Date of Consult: 03/06/2022  PCP:  [facility]   [facility] Providers Cardiologist:  None        Patient Profile:   Christina Valdez is a 41 y.o. female with a hx of HTN, pregnancy who is being seen 03/06/2022 for the evaluation of tachycardia at the request of PA Barrie Dunker  History of Present Illness:   Christina Valdez was in her normal state of health until 7 pm 03/06/22 when she developed acute onset dyspnea, non-productive cough, nausea, lightheaded symptoms, and heart racing. No prior symptoms like this. No fevers, chest pain, diarrhea, dehydration; no prior PE, PE risk factors. Called EMS, HR up to 160 bpm (per patient normally in 70s). Vagal manuevres unsuccessful but givne 500 ccs of fluids with improved HR, since in ED has undulated from 100 to 140 bpm. Normal labs, clean CXR.  EKGs c/w sinus tachycardia, otherwise normal. Telemetry with undulating HR 110-140 and no other arrhythmias    Past Medical History:  Diagnosis Date   Ganglion cyst of wrist, left    History of cervical incompetence    History of COVID-19 09/2020   per pt mild symptoms that resolved   Hypertension    IDA (iron deficiency anemia)    Infertility, female     Past Surgical History:  Procedure Laterality Date   COLONOSCOPY  08/2021   DILATION AND EVACUATION N/A 02/18/2021   Procedure: DILATATION AND EVACUATION;  Surgeon: Adam Phenix, MD;  Location: [facility];  Service: Gynecology;  Laterality: N/A;   EXCISIONAL HEMORRHOIDECTOMY  09/12/2019   in [place]   GANGLION CYST EXCISION Left 11/27/2021   Procedure: left dorsal carpal ganglion cyst excision;  Surgeon: Gomez Cleverly, MD;  Location: [facility];  Service: Orthopedics;  Laterality: Left;  with local   HYSTEROSCOPY WITH RESECTOSCOPE  2021   myomectomy       Inpatient Medications: Scheduled  Meds:  Continuous Infusions:  PRN Meds:   Allergies:   No Known Allergies  Social History:   Social History   Socioeconomic History   Marital status: Single    Spouse name: Not on file   Number of children: Not on file   Years of education: Not on file   Highest education level: Not on file  Occupational History   Not on file  Tobacco Use   Smoking status: Never   Smokeless tobacco: Never  Vaping Use   Vaping Use: Never used  Substance and Sexual Activity   Alcohol use: Not Currently   Drug use: Never   Sexual activity: Yes    Birth control/protection: None  Other Topics Concern   Not on file  Social History Narrative   Not on file   Social Determinants of Health   Financial Resource Strain: Not on file  Food Insecurity: Not on file  Transportation Needs: Not on file  Physical Activity: Not on file  Stress: Not on file  Social Connections: Not on file  Intimate Partner Violence: Not on file    Family History:    Family History  Problem Relation Age of Onset   Hypertension Mother    Healthy Father    Diabetes Paternal Grandmother      ROS:  Please see the history of present illness.   All other ROS reviewed and negative.     Physical Exam/Data:   Vitals:  03/06/22 2114 03/06/22 2115 03/06/22 2145 03/06/22 2200  BP:   (!) 155/95 (!) 145/94  Pulse: (!) 111 (!) 108 (!) 102 (!) 104  Resp: 18 18 20 16   Temp:      TempSrc:      SpO2: 100% 100% 100% 100%  Weight:      Height:       No intake or output data in the 24 hours ending 03/06/22 2228    03/06/2022    8:19 PM 11/27/2021    6:46 AM 11/24/2021    1:20 PM  Last 3 Weights  Weight (lbs) 194 lb 194 lb 6.4 oz 190 lb  Weight (kg) 87.998 kg 88.179 kg 86.183 kg     Body mass index is 29.5 kg/m.  General:  Well nourished, well developed, in no acute distress HEENT: normal Neck: no JVD Vascular: No carotid bruits; Distal pulses 2+ bilaterally Cardiac:  tachycaridc, S1, S2; RRR; no murmur   Lungs:  clear to auscultation bilaterally, no wheezing, rhonchi or rales  Abd: soft, nontender, no hepatomegaly  Ext: no edema Musculoskeletal:  No deformities, BUE and BLE strength normal and equal Skin: warm and dry  Neuro:  CNs 2-12 intact, no focal abnormalities noted Psych:  Normal affect     Laboratory Data:  High Sensitivity Troponin:   Recent Labs  Lab 03/06/22 2021  TROPONINIHS 4     Chemistry Recent Labs  Lab 03/06/22 2021  NA 137  K 3.5  CL 108  CO2 16*  GLUCOSE 169*  BUN 16  CREATININE 1.41*  CALCIUM 8.2*  GFRNONAA 48*  ANIONGAP 13    No results for input(s): "PROT", "ALBUMIN", "AST", "ALT", "ALKPHOS", "BILITOT" in the last 168 hours. Lipids No results for input(s): "CHOL", "TRIG", "HDL", "LABVLDL", "LDLCALC", "CHOLHDL" in the last 168 hours.  Hematology Recent Labs  Lab 03/06/22 2021  WBC 9.8  RBC 4.41  HGB 12.6  HCT 38.2  MCV 86.6  MCH 28.6  MCHC 33.0  RDW 14.0  PLT 262   Thyroid No results for input(s): "TSH", "FREET4" in the last 168 hours.  BNPNo results for input(s): "BNP", "PROBNP" in the last 168 hours.  DDimer No results for input(s): "DDIMER" in the last 168 hours.   Radiology/Studies:  DG Chest Port 1 View  Result Date: 03/06/2022 CLINICAL DATA:  Chest pain EXAM: PORTABLE CHEST 1 VIEW COMPARISON:  None Available. FINDINGS: The heart size and mediastinal contours are within normal limits. Both lungs are clear. The visualized skeletal structures are unremarkable. IMPRESSION: No active disease. Electronically Signed   By: 05/06/2022 M.D.   On: 03/06/2022 21:14     Assessment and Plan:  Christina Valdez has acute onset dyspnea and tachycardia, most concerning for PE. EKG and telemetry has ruled out a tachyarrhythmia. Labs/imaging as well as history makes sepsis unlikely, not anemic   - Ordered pregnancy test; if negative recommend CTA chest PE protocol - Ordered BNP; low suspicion of cardiomyopathy but would rule out. If elevated or no  other etiology of tachycardia will need a TTE - no BB or CCB since tachycardia is physiologic - judicious fluids per primary   Risk Assessment/Risk Scores:                For questions or updates, please contact [facility] Please consult [email or web address] for contact info under    Signed, Virgie Dad Levon Penning, MD  03/06/2022 10:28 PM

## 2022-03-06 NOTE — ED Provider Notes (Signed)
[facility] EMERGENCY DEPARTMENT Provider Note   CSN: [id number] Arrival date & time: 03/06/22  2003     History {Add pertinent medical, surgical, social history, OB history to HPI:1} Chief Complaint  Patient presents with   Tachycardia    Christina Valdez is a 41 y.o. female.  Patient presents to the hospital via EMS complaining of tachycardia.  Patient states she was at home and felt somewhat short of breath and felt that her heart was racing when she called 911.  EMS arrived and transported her and states that she appeared to be borderline SVT on their monitor.  They prepared to administer adenosine and the patient's heart rate reduced on its own.  The patient has been tachycardic since arriving at the emergency department with rates from 108-150.  The patient denies any pain, denies current shortness of breath, denies abdominal pain, vomiting.  Endorses mild nausea when she has the episodes.  The patient states she has never had episodes like this in the past.  She denies any recent illnesses . The patient states she takes medication for high blood pressure but is otherwise healthy.  HPI     Home Medications Prior to Admission medications   Medication Sig Start Date End Date Taking? Authorizing Provider  ferrous sulfate 220 (44 Fe) MG/5ML solution Take 10 mLs by mouth daily. 03/03/20   [provider]  NIFEdipine (PROCARDIA-XL/NIFEDICAL-XL) 30 MG 24 hr tablet 1 tablet by mount daily Patient taking differently: 30 mg daily. 1 tablet by mount daily 02/26/21   Hermina Staggers, MD  Prenatal Vit-Fe Fumarate-FA (PRENATAL VITAMINS PO) Take by mouth daily.    [provider]      Allergies    Patient has no known allergies.    Review of Systems   Review of Systems  Constitutional:  Negative for fever.  Respiratory:  Positive for shortness of breath.   Cardiovascular:  Positive for palpitations.  Gastrointestinal:  Positive for nausea. Negative for  abdominal pain, diarrhea and vomiting.  Psychiatric/Behavioral:  The patient is nervous/anxious.     Physical Exam Updated Vital Signs BP (!) 150/94   Pulse (!) 108   Temp (!) 97.5 F (36.4 C) (Oral)   Resp 18   Ht 5\' 8"  (1.727 m)   Wt 88 kg   SpO2 100%   BMI 29.50 kg/m  Physical Exam Vitals and nursing note reviewed.  Constitutional:      General: She is not in acute distress. HENT:     Head: Normocephalic.     Mouth/Throat:     Mouth: Mucous membranes are moist.  Eyes:     Conjunctiva/sclera: Conjunctivae normal.  Cardiovascular:     Rate and Rhythm: Regular rhythm. Tachycardia present.     Pulses: Normal pulses.     Heart sounds: Normal heart sounds.  Pulmonary:     Effort: Pulmonary effort is normal.     Breath sounds: Normal breath sounds.  Abdominal:     Palpations: Abdomen is soft.     Tenderness: There is no abdominal tenderness.  Musculoskeletal:        General: Normal range of motion.     Cervical back: Normal range of motion.  Skin:    General: Skin is warm and dry.     Capillary Refill: Capillary refill takes less than 2 seconds.  Neurological:     Mental Status: She is alert and oriented to person, place, and time.     ED Results /  Procedures / Treatments   Labs (all labs ordered are listed, but only abnormal results are displayed) Labs Reviewed  CBC  BASIC METABOLIC PANEL  TROPONIN I (HIGH SENSITIVITY)    EKG EKG Interpretation  Date/Time:  Friday March 06 2022 20:13:59 EDT Ventricular Rate:  114 PR Interval:  147 QRS Duration: 71 QT Interval:  356 QTC Calculation: 491 R Axis:   40 Text Interpretation: Sinus tachycardia Low voltage, precordial leads Borderline T abnormalities, diffuse leads Borderline prolonged QT interval Confirmed by Vanetta Mulders [phone]) on 03/06/2022 8:19:19 PM  Radiology DG Chest Port 1 View  Result Date: 03/06/2022 CLINICAL DATA:  Chest pain EXAM: PORTABLE CHEST 1 VIEW COMPARISON:  None Available. FINDINGS: The  heart size and mediastinal contours are within normal limits. Both lungs are clear. The visualized skeletal structures are unremarkable. IMPRESSION: No active disease. Electronically Signed   By: Charlett Nose M.D.   On: 03/06/2022 21:14    Procedures Procedures  {Document cardiac monitor, telemetry assessment procedure when appropriate:1}  Medications Ordered in ED Medications  sodium chloride 0.9 % bolus 1,000 mL (has no administration in time range)  ondansetron (ZOFRAN) injection 4 mg (has no administration in time range)    ED Course/ Medical Decision Making/ A&P                           Medical Decision Making Amount and/or Complexity of Data Reviewed Labs: ordered. Radiology: ordered.  Risk Prescription drug management.   .mil  {Document critical care time when appropriate:1} {Document review of labs and clinical decision tools ie heart score, Chads2Vasc2 etc:1}  {Document your independent review of radiology images, and any outside records:1} {Document your discussion with family members, caretakers, and with consultants:1} {Document social determinants of health affecting pt's care:1} {Document your decision making why or why not admission, treatments were needed:1} Final Clinical Impression(s) / ED Diagnoses Final diagnoses:  None    Rx / DC Orders ED Discharge Orders     None

## 2022-03-07 MED ORDER — IOHEXOL 350 MG/ML SOLN
80.0000 mL | Freq: Once | INTRAVENOUS | Status: AC | PRN
  Administered 2022-03-06: 80 mL via INTRAVENOUS

## 2022-03-07 NOTE — Discharge Instructions (Addendum)
You were seen today for a fast heart rate which has resolved.  Your work-up was reassuring for no pulmonary embolism.  Please follow-up with cardiology for an echocardiogram.  Do not hesitate to call 911 or return to the hospital if you experience chest pain, shortness of breath, or other life-threatening condition

## 2022-03-21 IMAGING — US US OB < 14 WEEKS - US OB TV
1 series · 13 of 28 positions shown · non-contrast
Comparison: None.

CLINICAL DATA: Vaginal bleeding

EXAM:
OBSTETRIC <14 WK US AND TRANSVAGINAL OB US
TECHNIQUE: Both transabdominal and transvaginal ultrasound examinations were
performed for complete evaluation of the gestation as well as the
maternal uterus, adnexal regions, and pelvic cul-de-sac.
Transvaginal technique was performed to assess early pregnancy.

[Series 1: us ob less than 14 weeks with ob transvaginal · 123 acquisitions, 13 frames shown]
[im 5/123]
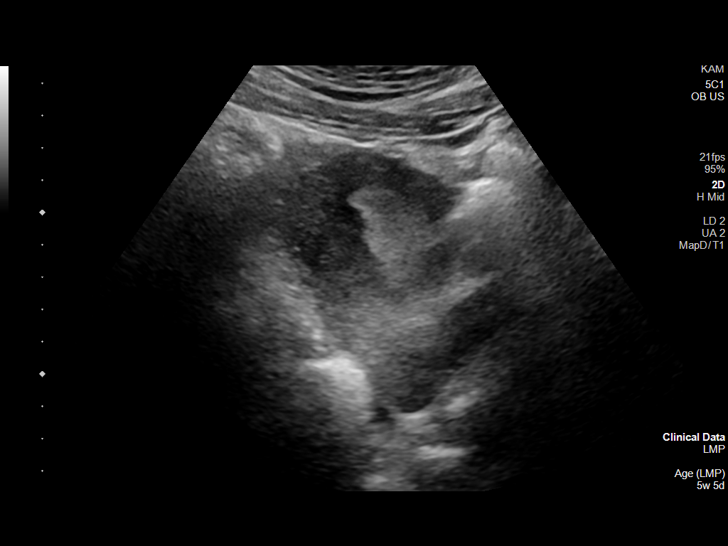
[im 14/123]
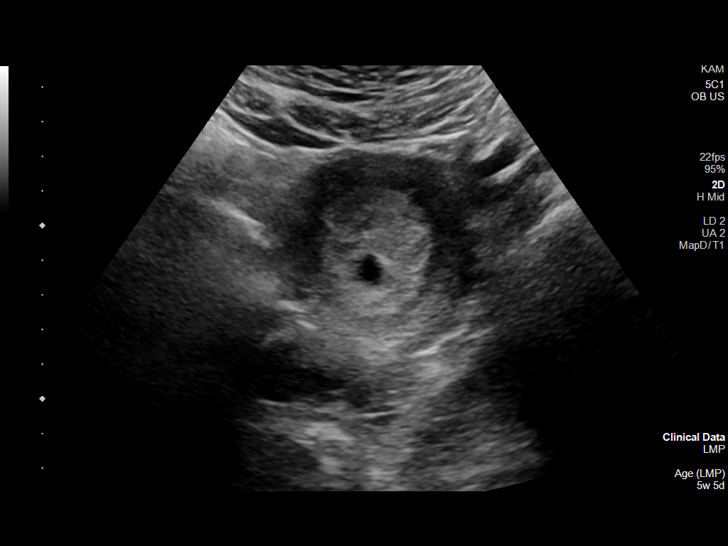
[im 23/123]
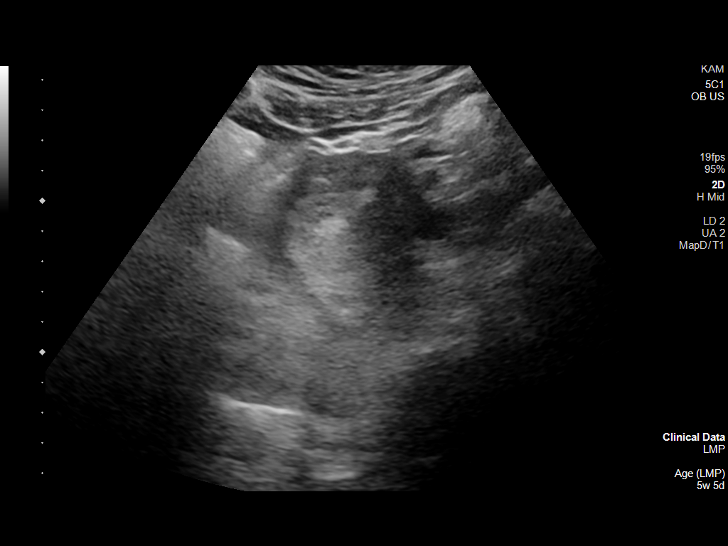
[im 32/123]
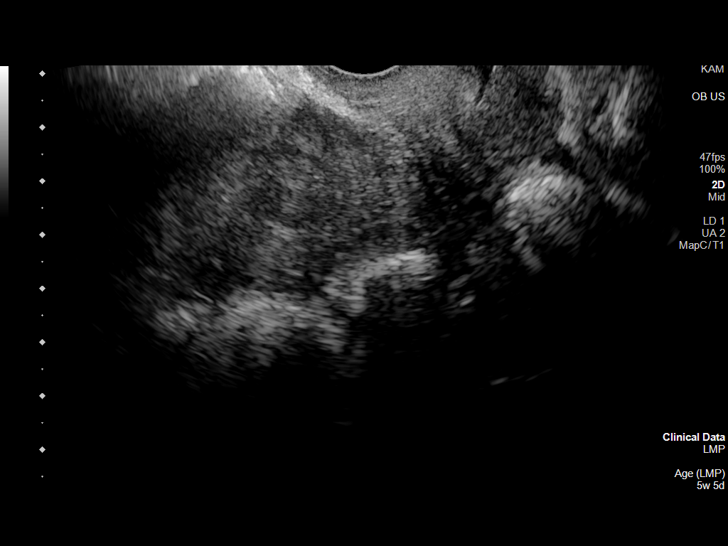
[im 41/123]
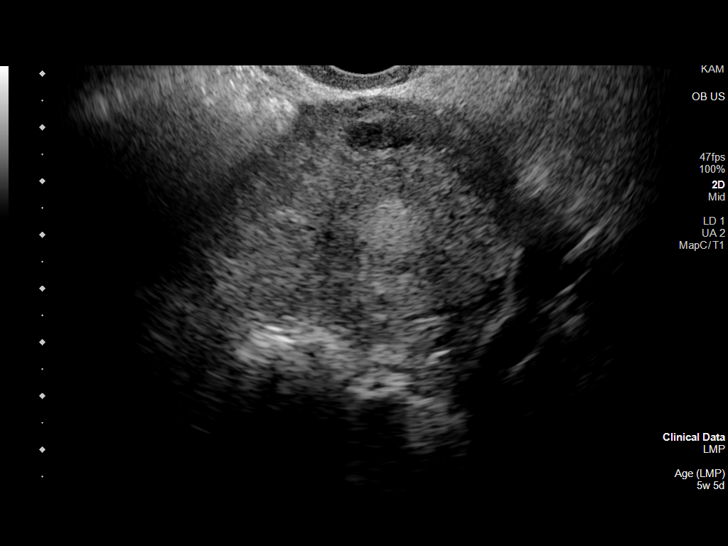
[im 50/123]
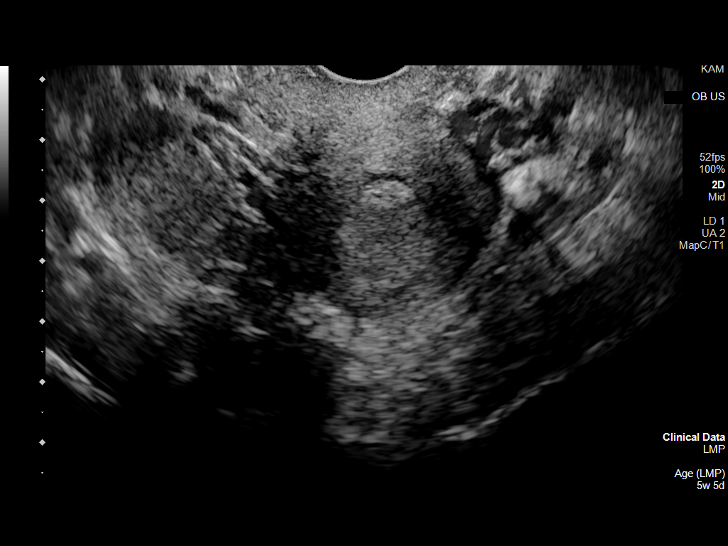
[im 64/123]
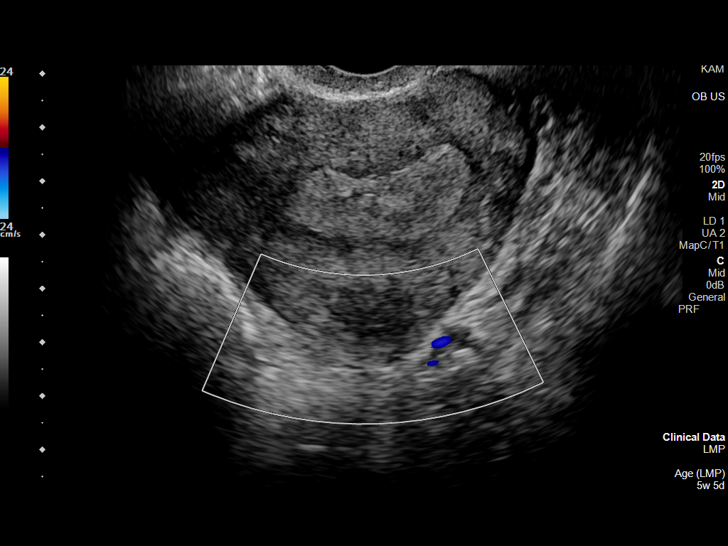
[im 73/123]
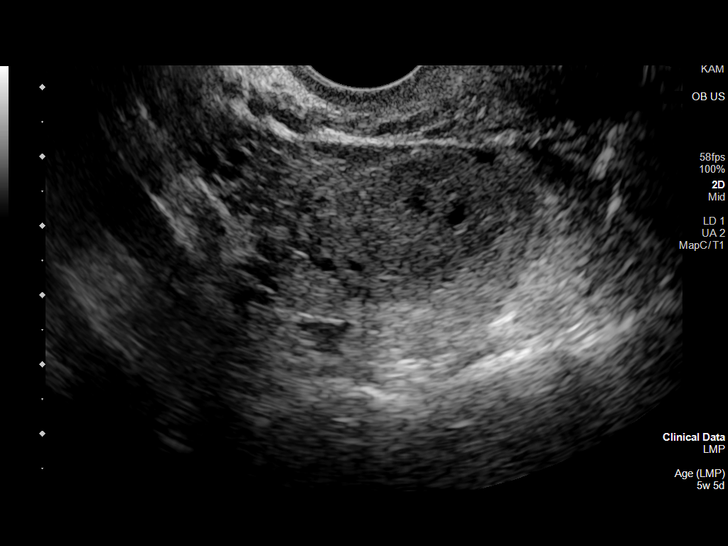
[im 82/123]
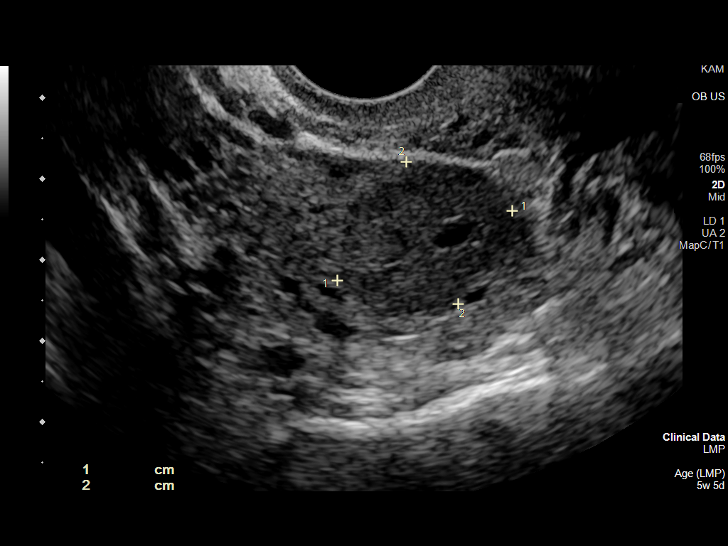
[im 91/123]
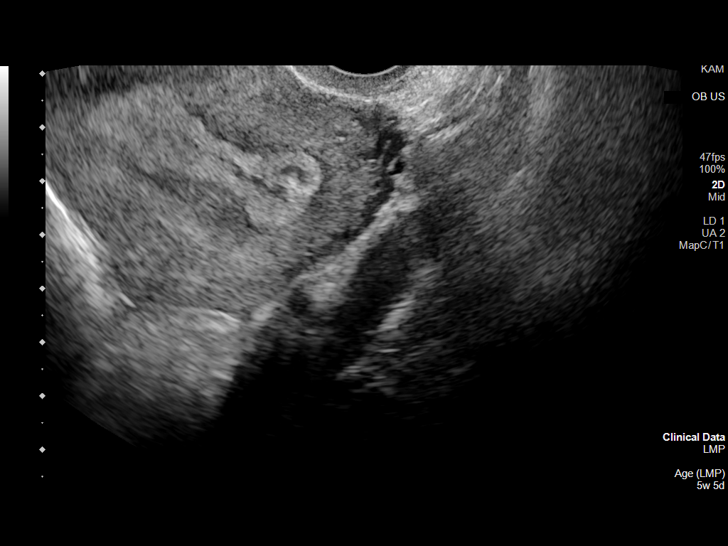
[im 100/123]
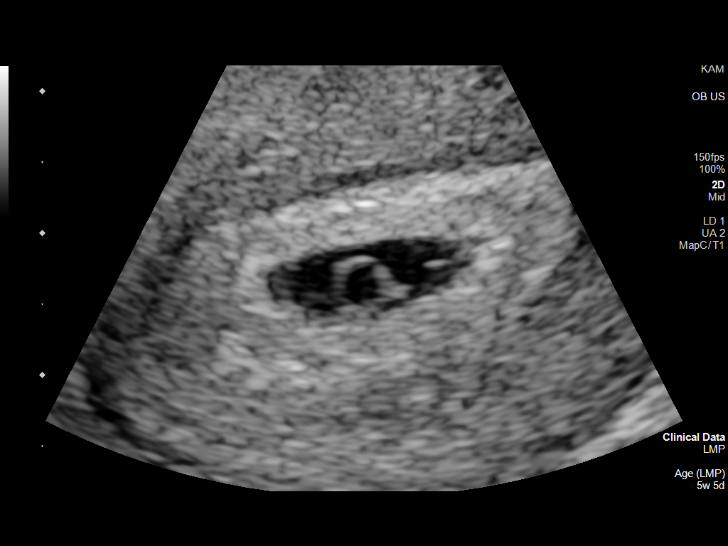
[im 109/123]
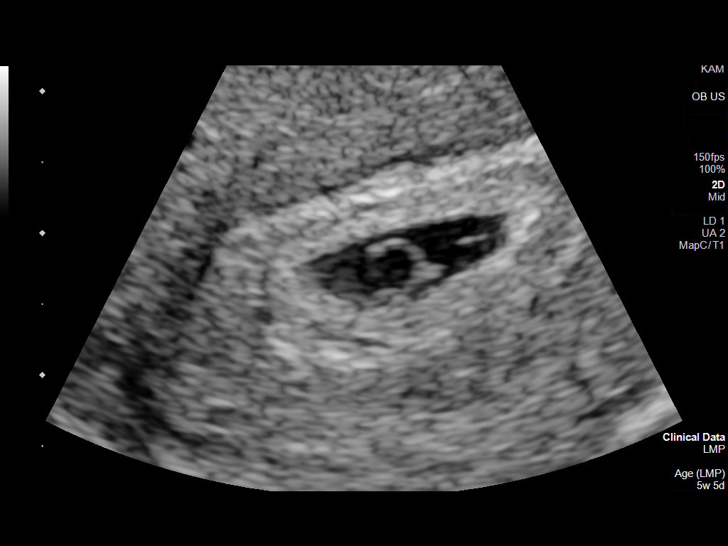
[im 118/123]
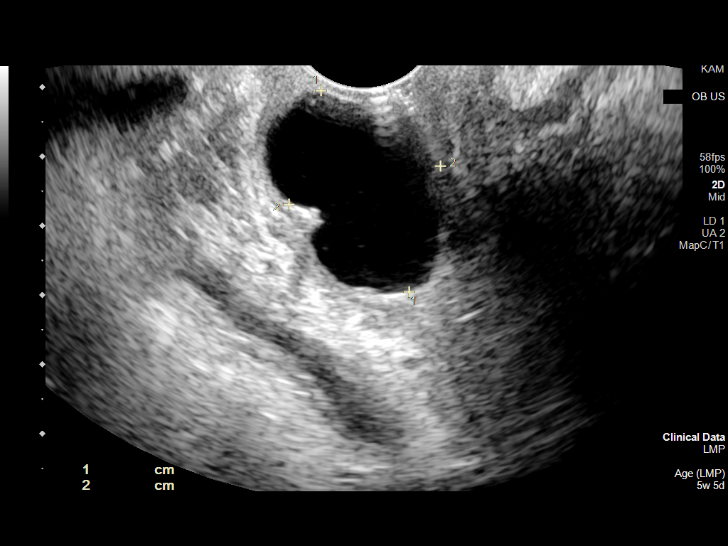

[13 of 28 positions shown; findings below may reference images not displayed]

FINDINGS: Intrauterine gestational sac: Single

Yolk sac:  Visualized.

Embryo:  Visualized.

Cardiac Activity: Visualized.

Heart Rate: 104 bpm

MSD:   mm    w     d

CRL:  2.4 mm   5 w   6 d                  US EDC: [id number]

Subchorionic hemorrhage:  Small 2 moderate subchorionic hemorrhage.

Maternal uterus/adnexae: Fibroids noted. Fundal fibroid measures up
to 1.3 cm. Posterior mid body fibroid measures up to 2 cm. No
adnexal mass or free fluid. Fluid collection or cystic area within
the vagina measuring 3.2 x 2.3 x 1.7 cm.
IMPRESSION: Five week 6 day intrauterine pregnancy. Fetal heart rate 104 beats
per minute. Small to moderate subchorionic hemorrhage.

Fluid collection or cyst in the vagina measuring up to 3.2 cm. This
could be further evaluated with direct visualization.

Small fibroids.

## 2022-04-28 IMAGING — US US OB LIMITED
1 series · 1 of 1 positions shown · non-contrast
Comparison: none

[Series 1: us ob limited · 1 of 1 slices shown]
[im 1/1]
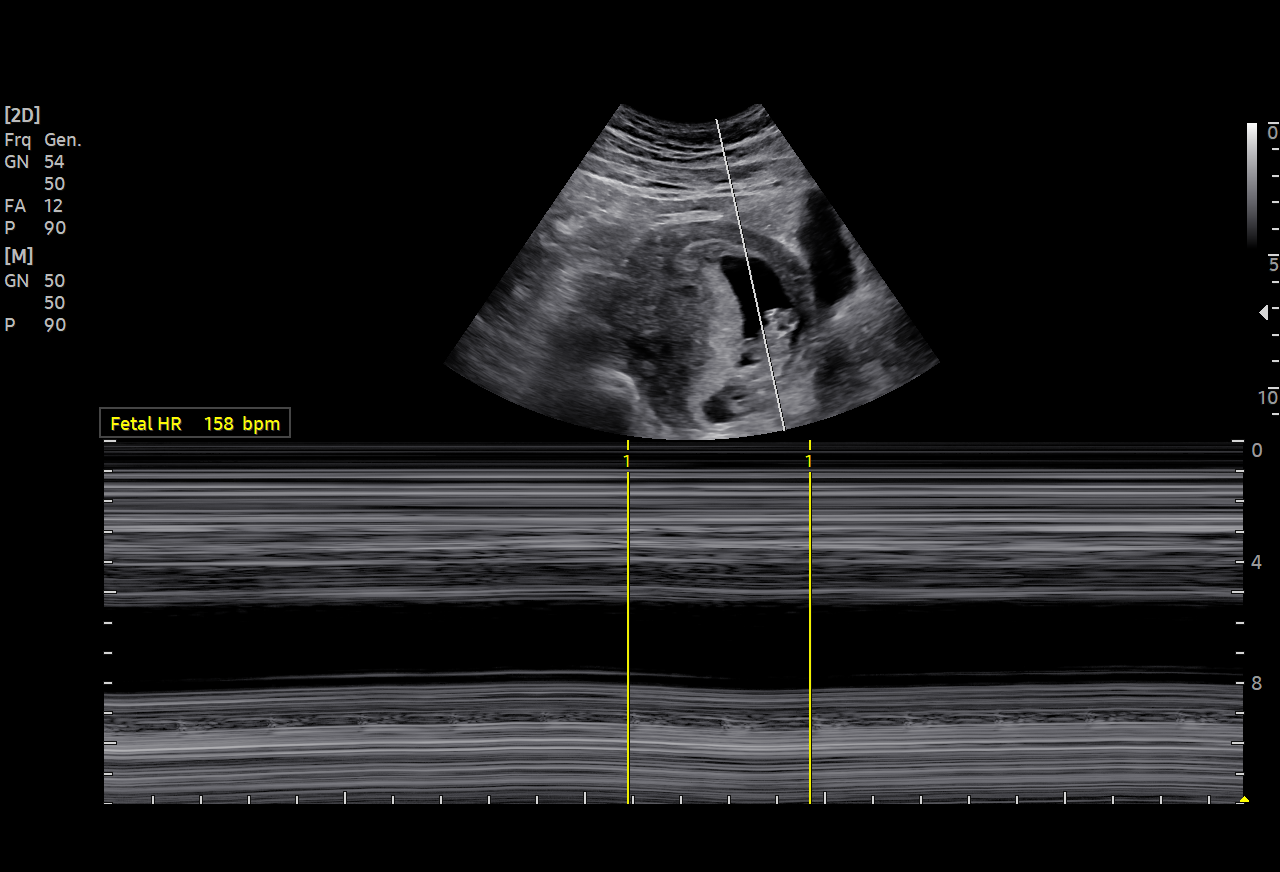

[1 of 1 positions shown; findings below may reference images not displayed]

[REDACTED]care

 1  [HOSPITAL]                         [address].0     [facility]

Indications

 11 weeks gestation of pregnancy
 Unable to hear fetal heart tones as reason     O76
 for ultrasound
Fetal Evaluation

 Num Of Fetuses:         1
 Fetal Heart Rate(bpm):  158
 Cardiac Activity:       Observed
Comments

 Unable to hear fetal heart tones with hand held doppler. FHR
 158
Impression

 Fetal cardiac activiety noted.
Recommendations

 Follow up OB U/S as clinically indicated

## 2022-08-04 ENCOUNTER — Other Ambulatory Visit: Payer: Self-pay

## 2022-08-04 ENCOUNTER — Emergency Department (HOSPITAL_BASED_OUTPATIENT_CLINIC_OR_DEPARTMENT_OTHER)
Admission: EM | Admit: 2022-08-04 | Discharge: 2022-08-04 | Disposition: A | Discharge status: Home / Self Care | Payer: No Typology Code available for payment source | Attending: Emergency Medicine | Admitting: Emergency Medicine

## 2022-08-04 ENCOUNTER — Emergency Department (HOSPITAL_BASED_OUTPATIENT_CLINIC_OR_DEPARTMENT_OTHER): Payer: No Typology Code available for payment source | Admitting: Radiology

## 2022-08-04 ENCOUNTER — Encounter (HOSPITAL_BASED_OUTPATIENT_CLINIC_OR_DEPARTMENT_OTHER): Payer: Self-pay | Admitting: Emergency Medicine

## 2022-08-04 DIAGNOSIS — I10 Essential (primary) hypertension: Secondary | ICD-10-CM | POA: Diagnosis not present

## 2022-08-04 DIAGNOSIS — R0600 Dyspnea, unspecified: Secondary | ICD-10-CM | POA: Diagnosis not present

## 2022-08-04 DIAGNOSIS — R0602 Shortness of breath: Secondary | ICD-10-CM | POA: Insufficient documentation

## 2022-08-04 DIAGNOSIS — Z79899 Other long term (current) drug therapy: Secondary | ICD-10-CM | POA: Diagnosis not present

## 2022-08-04 LAB — CBC
HCT: 39.4 % (ref 36.0–46.0)
Hemoglobin: 12.8 g/dL (ref 12.0–15.0)
MCH: 27.8 pg (ref 26.0–34.0)
MCHC: 32.5 g/dL (ref 30.0–36.0)
MCV: 85.5 fL (ref 80.0–100.0)
Platelets: 266 10*3/uL (ref 150–400)
RBC: 4.61 MIL/uL (ref 3.87–5.11)
RDW: 14.3 % (ref 11.5–15.5)
WBC: 6 10*3/uL (ref 4.0–10.5)
nRBC: 0 % (ref 0.0–0.2)

## 2022-08-04 LAB — BASIC METABOLIC PANEL
Anion gap: 9 (ref 5–15)
BUN: 13 mg/dL (ref 6–20)
CO2: 26 mmol/L (ref 22–32)
Calcium: 9.7 mg/dL (ref 8.9–10.3)
Chloride: 105 mmol/L (ref 98–111)
Creatinine, Ser: 0.98 mg/dL (ref 0.44–1.00)
GFR, Estimated: >60 mL/min (ref >60)
Glucose, Bld: 96 mg/dL (ref 70–99)
Potassium: 3.8 mmol/L (ref 3.5–5.1)
Sodium: 140 mmol/L (ref 135–145)

## 2022-08-04 LAB — PREGNANCY, URINE: Preg Test, Ur: NEGATIVE

## 2022-08-04 LAB — TROPONIN I (HIGH SENSITIVITY): Troponin I (High Sensitivity): <2 ng/L (ref <18)

## 2022-08-04 MED ORDER — FAMOTIDINE 20 MG PO TABS
20.0000 mg | ORAL_TABLET | Freq: Once | ORAL | Status: AC
  Administered 2022-08-04: 20 mg via ORAL
  Filled 2022-08-04: qty 1

## 2022-08-04 MED ORDER — ALUM & MAG HYDROXIDE-SIMETH 200-200-20 MG/5ML PO SUSP
30.0000 mL | Freq: Once | ORAL | Status: AC
  Administered 2022-08-04: 30 mL via ORAL
  Filled 2022-08-04: qty 30

## 2022-08-04 NOTE — ED Notes (Signed)
Discharge instructions and follow up care reviewed and explained, pt verbalized understanding and had no further questions on d/c. Pt caox4 and ambulatory on d/c.  

## 2022-08-04 NOTE — ED Triage Notes (Signed)
Pt from home c/o SOB that started last night. At approx midnight Pt was awoken from sleep with tightness in her chest and SOB. Pt stated that she was gasping for air and shallow breaths. Pt stated that the episode lasted until about 0600 this morning. Pt stated SOB has resolved, tightness remains but is listed as  a 0 out of 10 at this time.

## 2022-08-04 NOTE — ED Notes (Signed)
Pt given urine cup to provide a sample. 

## 2022-08-04 NOTE — ED Provider Notes (Signed)
[facility] EMERGENCY DEPT Provider Note   CSN: [id number] Arrival date & time: 08/04/22  5329     History  Chief Complaint  Patient presents with   Shortness of Breath    Christina Valdez is a 41 y.o. female.   Shortness of Breath Patient is a 41 year old female with a past medical history significant for hypertension on BP medications, infertility, iron deficiency anemia  She presented emergency room today with chief complaint of some shortness of breath overnight.  She states she went to bed at approximately 10 PM and awoke at midnight because she states she felt somewhat short of breath.  She got up and walked around drink some water and felt better.  She fell asleep again but woke up 2 more times before 6 AM feeling somewhat short of breath each time.  She states she has had no chest pain no lightheadedness dizziness nausea or vomiting.  She states that her symptoms completely resolved by the time she was up and walking around at 6 AM this morning.  She denies any lower extremity swelling or calf pain.  She denies any cough or hemoptysis.  She is not on any oral contraceptive medications.  No recent travel or hospitalizations or Surgeries  No other significant associated symptoms.  She states that she is on a long-term monitor that was removed recently by [place] and she is awaiting results of this.    Home Medications Prior to Admission medications   Medication Sig Start Date End Date Taking? Authorizing Provider  ferrous sulfate 220 (44 Fe) MG/5ML solution Take 10 mLs by mouth daily. 03/03/20   [provider]  NIFEdipine (PROCARDIA-XL/NIFEDICAL-XL) 30 MG 24 hr tablet 1 tablet by mount daily Patient taking differently: 30 mg daily. 02/26/21   Chancy Milroy, MD  olmesartan-hydrochlorothiazide (BENICAR HCT) 40-12.5 MG tablet Take 1 tablet by mouth daily. 07/27/22   [provider]      Allergies    Patient has no known allergies.    Review of  Systems   Review of Systems  Respiratory:  Positive for shortness of breath.     Physical Exam Updated Vital Signs BP (!) 159/91   Pulse (!) 52   Temp 98.3 F (36.8 C)   Resp 19   Ht 5\' 8"  (1.727 m)   Wt 77.1 kg   LMP 07/25/2022 (Approximate)   SpO2 100%   BMI 25.85 kg/m  Physical Exam Vitals and nursing note reviewed.  Constitutional:      General: She is not in acute distress.    Comments: Pleasant well-appearing 41 year old.  In no acute distress.  Sitting comfortably in bed.  Able answer questions appropriately follow commands. No increased work of breathing. Speaking in full sentences.   HENT:     Head: Normocephalic and atraumatic.     Nose: Nose normal.     Mouth/Throat:     Mouth: Mucous membranes are moist.  Eyes:     General: No scleral icterus. Cardiovascular:     Rate and Rhythm: Normal rate and regular rhythm.     Pulses: Normal pulses.     Heart sounds: Normal heart sounds.  Pulmonary:     Effort: Pulmonary effort is normal. No respiratory distress.     Breath sounds: Normal breath sounds. No wheezing.  Abdominal:     Palpations: Abdomen is soft.     Tenderness: There is no abdominal tenderness.  Musculoskeletal:     Cervical back: Normal range of motion.  Right lower leg: No edema.     Left lower leg: No edema.  Skin:    General: Skin is warm and dry.     Capillary Refill: Capillary refill takes less than 2 seconds.  Neurological:     Mental Status: She is alert. Mental status is at baseline.  Psychiatric:        Mood and Affect: Mood normal.        Behavior: Behavior normal.     ED Results / Procedures / Treatments   Labs (all labs ordered are listed, but only abnormal results are displayed) Labs Reviewed  BASIC METABOLIC PANEL  CBC  PREGNANCY, URINE  TROPONIN I (HIGH SENSITIVITY)    EKG None  Radiology DG Chest 2 View  Result Date: 08/04/2022 CLINICAL DATA:  Chest tightness.  Shortness of breath. EXAM: CHEST - 2 VIEW  COMPARISON:  03/07/2022 FINDINGS: The heart size and mediastinal contours are within normal limits. Both lungs are clear. The visualized skeletal structures are unremarkable. IMPRESSION: No active cardiopulmonary disease. Electronically Signed   By: Signa Kell M.D.   On: 08/04/2022 10:46    Procedures Procedures    Medications Ordered in ED Medications  alum & mag hydroxide-simeth (MAALOX/MYLANTA) 200-200-20 MG/5ML suspension 30 mL (30 mLs Oral Given 08/04/22 1310)  famotidine (PEPCID) tablet 20 mg (20 mg Oral Given 08/04/22 1309)    ED Course/ Medical Decision Making/ A&P                           Medical Decision Making Amount and/or Complexity of Data Reviewed Labs: ordered. Radiology: ordered.  Risk OTC drugs.   This patient presents to the ED for concern of dyspnea overnight, this involves a number of treatment options, and is a complaint that carries with it a moderate to high risk of complications and morbidity. A differential diagnosis was considered for the patient's symptoms which is discussed below:   The causes for shortness of breath include but are not limited to Cardiac (AHF, pericardial effusion and tamponade, arrhythmias, ischemia, etc) Respiratory (COPD, asthma, pneumonia, pneumothorax, primary pulmonary hypertension, PE/VQ mismatch) Hematological (anemia) Neuromuscular (ALS, Guillain-Barr, etc)    Co morbidities: Discussed in HPI   Brief History:  Patient is a 41 year old female with a past medical history significant for hypertension on BP medications, infertility, iron deficiency anemia  She presented emergency room today with chief complaint of some shortness of breath overnight.  She states she went to bed at approximately 10 PM and awoke at midnight because she states she felt somewhat short of breath.  She got up and walked around drink some water and felt better.  She fell asleep again but woke up 2 more times before 6 AM feeling somewhat short of  breath each time.  She states she has had no chest pain no lightheadedness dizziness nausea or vomiting.  She states that her symptoms completely resolved by the time she was up and walking around at 6 AM this morning.  She denies any lower extremity swelling or calf pain.  She denies any cough or hemoptysis.  She is not on any oral contraceptive medications.  No recent travel or hospitalizations or Surgeries  No other significant associated symptoms.  She states that she is on a long-term monitor that was removed recently by [place] and she is awaiting results of this.    EMR reviewed including pt PMHx, past surgical history and past visits to ER.   See  HPI for more details   Lab Tests:   I personally reviewed all laboratory work and imaging. Metabolic panel without any acute abnormality specifically kidney function within normal limits and no significant electrolyte abnormalities. CBC without leukocytosis or significant anemia. Troponin which was obtained at approximately 9 AM approximately 3 hours after his symptoms resolved and 9 hours after her symptoms began was within normal limits.  Imaging Studies:  NAD. I personally reviewed all imaging studies and no acute abnormality found. I agree with radiology interpretation.    Cardiac Monitoring:  The patient was maintained on a cardiac monitor.  I personally viewed and interpreted the cardiac monitored which showed an underlying rhythm of: NSR EKG non-ischemic   Medicines ordered:  I ordered medication including GI cocktail and Pepcid for possible reflux related symptoms Reevaluation of the patient after these medicines showed that the patient stayed the same I have reviewed the patients home medicines and have made adjustments as needed   Critical Interventions:     Consults/Attending Physician      Reevaluation:  After the interventions noted above I re-evaluated patient and found that they have :stayed the  same   Social Determinants of Health:      Problem List / ED Course:  Dyspnea overnight.  She woke up 3 times in the middle of the night feeling somewhat short of breath but was able to fall asleep afterwards each time.  Is symptom-free now.  Troponin was drawn 3 hours after her symptoms resolved is normal.  She had persistent symptoms for approximately 6 hours between midnight and 6 AM.  Work-up here reassuring.  She has been seen for some tachycardic episodes in the past and had a negative PET scan done 5 months ago.  I have very low suspicion for pulmonary embolism and patient is in fact PERC negative.  She is symptom-free currently.  No evidence of pneumothorax on chest x-ray, doubt dissection with complete resolution of her symptoms and no chest pain or paresthesias numbness or weakness, and symmetric pulses.  Low suspicion for pericarditis or myocarditis the latter of which would have elevated her troponins and the former is not supported by her history, physical exam, EKG.  Return precautions discussed.  She will follow-up with her care team at the [place].  She will return to the emergency room for any return of symptoms or any new or concerning symptoms.  As she is completely symptom-free at this time.  Will discharge home with return precautions.   Dispostion:  After consideration of the diagnostic results and the patients response to treatment, I feel that the patent would benefit from close outpatient follow-up.   Final Clinical Impression(s) / ED Diagnoses Final diagnoses:  Dyspnea, unspecified type    Rx / DC Orders ED Discharge Orders     None         Gailen Shelter, [place] 08/04/22 1328    Ernie Avena, MD 08/04/22 1723

## 2022-08-04 NOTE — Discharge Instructions (Signed)
I am glad to hear that you are difficulty breathing has completely resolved.  I recommend following up with your [place] cardiologist.  Return to the emergency room for any new or concerning symptoms including any chest pain or difficulty breathing or lightheadedness or dizziness.  Take all of your prescribed indications as prescribed including her blood pressure medications.

## 2022-11-17 ENCOUNTER — Other Ambulatory Visit: Payer: Self-pay | Admitting: Maternal and Fetal Medicine

## 2022-11-17 ENCOUNTER — Ambulatory Visit: Payer: BC Managed Care – PPO | Attending: Obstetrics and Gynecology

## 2022-11-17 DIAGNOSIS — Z3169 Encounter for other general counseling and advice on procreation: Secondary | ICD-10-CM

## 2022-11-17 DIAGNOSIS — I10 Essential (primary) hypertension: Secondary | ICD-10-CM | POA: Diagnosis not present

## 2022-11-17 DIAGNOSIS — D649 Anemia, unspecified: Secondary | ICD-10-CM

## 2022-11-17 LAB — EXERCISE TOLERANCE TEST
Angina Index: 0
Duke Treadmill Score: 9
Estimated workload: 10.5
Exercise duration (min): 9 min
Exercise duration (sec): 18 s
MPHR: 177 {beats}/min
Peak HR: 160 {beats}/min
Percent HR: 90 %
RPE: 16
Rest HR: 73 {beats}/min
ST Depression (mm): 0 mm

## 2023-03-28 ENCOUNTER — Inpatient Hospital Stay (HOSPITAL_COMMUNITY): Payer: BC Managed Care – PPO

## 2023-03-28 ENCOUNTER — Inpatient Hospital Stay (HOSPITAL_COMMUNITY)
Admission: AD | Admit: 2023-03-28 | Discharge: 2023-03-28 | Disposition: A | Discharge status: Home / Self Care | Payer: BC Managed Care – PPO | Attending: Obstetrics and Gynecology | Admitting: Obstetrics and Gynecology

## 2023-03-28 ENCOUNTER — Encounter (HOSPITAL_COMMUNITY): Payer: Self-pay | Admitting: *Deleted

## 2023-03-28 DIAGNOSIS — Z8616 Personal history of COVID-19: Secondary | ICD-10-CM | POA: Diagnosis not present

## 2023-03-28 DIAGNOSIS — Z3A01 Less than 8 weeks gestation of pregnancy: Secondary | ICD-10-CM | POA: Diagnosis not present

## 2023-03-28 DIAGNOSIS — O034 Incomplete spontaneous abortion without complication: Secondary | ICD-10-CM | POA: Diagnosis not present

## 2023-03-28 DIAGNOSIS — O209 Hemorrhage in early pregnancy, unspecified: Secondary | ICD-10-CM

## 2023-03-28 DIAGNOSIS — O200 Threatened abortion: Secondary | ICD-10-CM

## 2023-03-28 HISTORY — DX: Benign neoplasm of connective and other soft tissue, unspecified: D21.9

## 2023-03-28 LAB — CBC
HCT: 39.8 % (ref 36.0–46.0)
Hemoglobin: 13.4 g/dL (ref 12.0–15.0)
MCH: 29.3 pg (ref 26.0–34.0)
MCHC: 33.7 g/dL (ref 30.0–36.0)
MCV: 87.1 fL (ref 80.0–100.0)
Platelets: 257 10*3/uL (ref 150–400)
RBC: 4.57 MIL/uL (ref 3.87–5.11)
RDW: 13.2 % (ref 11.5–15.5)
WBC: 8.2 10*3/uL (ref 4.0–10.5)
nRBC: 0 % (ref 0.0–0.2)

## 2023-03-28 LAB — WET PREP, GENITAL
Clue Cells Wet Prep HPF POC: NONE SEEN
Sperm: NONE SEEN
Trich, Wet Prep: NONE SEEN
WBC, Wet Prep HPF POC: >=10 — AB (ref <10)
Yeast Wet Prep HPF POC: NONE SEEN

## 2023-03-28 LAB — HCG, QUANTITATIVE, PREGNANCY: hCG, Beta Chain, Quant, S: 19681 m[IU]/mL — ABNORMAL HIGH (ref <5)

## 2023-03-28 LAB — HIV ANTIBODY (ROUTINE TESTING W REFLEX): HIV Screen 4th Generation wRfx: NONREACTIVE

## 2023-03-28 NOTE — MAU Note (Signed)
Christina Valdez is a 42 y.o. at [redacted]w[redacted]d here in MAU reporting: felt a gush today, thought it was d/c, when she went to the bathroom, it was blood.  Also noted blood when she was urinating. No clots. No pain.  Onset of complaint: ~1600 Pain score: none Vitals:   03/28/23 1715  BP: 138/86  Pulse: 69  Resp: 17  Temp: 98.3 F (36.8 C)  SpO2: 100%     Lab orders placed from triage:   [place] at [facility] this past wk

## 2023-03-28 NOTE — MAU Provider Note (Signed)
History     CSN: [id number]  Arrival date and time: 03/28/23 1623   Event Date/Time   First Provider Initiated Contact with Patient 03/28/23 1920      Chief Complaint  Patient presents with   Vaginal Bleeding   HPI Ms. Christina Valdez is a 42 y.o. year old G52P0010 female at [redacted]w[redacted]d weeks gestation 6.1 wks U/S who presents to MAU reporting feeling a gush at 1600 today. She thought it was vaginal discharge, but when she went to the BR, she saw it was BRB. She reports the amount is much less now and the color is brown now. She denies any blood clots or pain. Last SI was  ~ 2 weeks ago. Her high risk pregnancy is complicated by: AMA, cHTN (on Procardia XL 30 ml), obesity, h/o 15 wk SAB secondary to cervical insufficiency. She receives [facility] with [facility]; next appt for repeat U/S is 04/21/2023. Her partner is present and contributing to the history taking.     **Care Everywhere Review: [place] 6/26: correction fetal pole was seen (d/w Ramon), but not FHR and pt. LMP was 01/25/2023 and not 02/24/2023. EDD 11/16/2023 per 1st trimester [place]. G2P0010 at [redacted]w[redacted]d today. RTO in 4 weeks for OB visit and repat [place] to assess FHT. Baseline PIH labs today. Continue procardia 30XL for CHTN, start aspirin at 12 weeks. Will need MFM consult. H/o SAB 15 weeks d/t cervical insufficiency. Consider cerclage. H/o LEEP 2008. Last pap smear wnl neg HPV 07/2022.   OB History     Gravida  2   Para      Term      Preterm      AB  1   Living         SAB      IAB      Ectopic      Multiple      Live Births           Obstetric Comments  2022 inc cervix, del at 15wks         Past Medical History:  Diagnosis Date   Fibroid    Ganglion cyst of wrist, left    History of cervical incompetence    History of COVID-19 09/2020   per pt mild symptoms that resolved   Hypertension    IDA (iron deficiency anemia)    Infertility, female     Past Surgical History:  Procedure Laterality Date    COLONOSCOPY  08/2021   DILATION AND EVACUATION N/A 02/18/2021   Procedure: DILATATION AND EVACUATION;  Surgeon: Adam Phenix, MD;  Location: [facility] LD ORS;  Service: Gynecology;  Laterality: N/A;   EXCISIONAL HEMORRHOIDECTOMY  09/12/2019   in [place]   GANGLION CYST EXCISION Left 11/27/2021   Procedure: left dorsal carpal ganglion cyst excision;  Surgeon: Gomez Cleverly, MD;  Location[facility];  Service: Orthopedics;  Laterality: Left;  with local   HYSTEROSCOPY WITH RESECTOSCOPE  2021   myomectomy    Family History  Problem Relation Age of Onset   Hypertension Mother    Healthy Father    Diabetes Paternal Grandmother     Social History   Tobacco Use   Smoking status: Never   Smokeless tobacco: Never  Vaping Use   Vaping Use: Never used  Substance Use Topics   Alcohol use: Not Currently   Drug use: Never    Allergies: No Known Allergies  Medications Prior to Admission  Medication Sig Dispense Refill Last  Dose   NIFEdipine (PROCARDIA-XL/NIFEDICAL-XL) 30 MG 24 hr tablet 1 tablet by mount daily (Patient taking differently: 30 mg daily.) 30 tablet 2 03/28/2023 at 0930   Prenatal Vit-Fe Fumarate-FA (MULTIVITAMIN-PRENATAL) 27-0.8 MG TABS tablet Take 1 tablet by mouth daily at 12 noon.   03/28/2023   ferrous sulfate 220 (44 Fe) MG/5ML solution Take 10 mLs by mouth daily.   03/26/2023   olmesartan-hydrochlorothiazide (BENICAR HCT) 40-12.5 MG tablet Take 1 tablet by mouth daily.       Review of Systems  Constitutional: Negative.   HENT: Negative.    Eyes: Negative.   Respiratory: Negative.    Cardiovascular: Negative.   Gastrointestinal: Negative.   Endocrine: Negative.   Genitourinary:  Positive for vaginal bleeding (BRB at 1600, now brown discharge and smaller amount). Negative for pelvic pain.  Musculoskeletal: Negative.   Skin: Negative.   Allergic/Immunologic: Negative.   Neurological: Negative.   Hematological: Negative.   Psychiatric/Behavioral: Negative.      Physical Exam   Blood pressure 138/86, pulse 69, temperature 98.3 F (36.8 C), temperature source Oral, resp. rate 17, height 5\' 8"  (1.727 m), weight 85.2 kg, last menstrual period 01/25/2023, SpO2 100 %.  Physical Exam Vitals and nursing note reviewed. Exam conducted with a chaperone present.  Constitutional:      Appearance: Normal appearance.  Cardiovascular:     Rate and Rhythm: Normal rate.  Pulmonary:     Effort: Pulmonary effort is normal.  Abdominal:     Palpations: Abdomen is soft.  Genitourinary:    General: Normal vulva.     Comments: Pelvic exam: External genitalia normal, SE: vaginal walls pink and well rugated, cervix is smooth, pink, no lesions, small amt of dark, brown (coffee colored) vaginal d/c -- WP, GC/CT done, cervix visually closed, Uterus is non-tender, no CMT or friability, no adnexal tenderness.  Musculoskeletal:        General: Normal range of motion.  Skin:    General: Skin is warm and dry.  Neurological:     Mental Status: She is alert and oriented to person, place, and time.  Psychiatric:        Mood and Affect: Mood normal.        Behavior: Behavior normal.        Thought Content: Thought content normal.        Judgment: Judgment normal.    MAU Course  Procedures  MDM [facility] UPT CBC ABO/Rh -- not drawn; known B POS HCG Wet Prep GC/CT -- pending HIV -- pending OB < 14 wks [place] with TV  Results for orders placed or performed during the hospital encounter of 03/28/23 (from the past 24 hour(s))  Wet prep, genital     Status: Abnormal   Collection Time: 03/28/23  7:29 PM  Result Value Ref Range   Yeast Wet Prep HPF POC NONE SEEN NONE SEEN   Trich, Wet Prep NONE SEEN NONE SEEN   Clue Cells Wet Prep HPF POC NONE SEEN NONE SEEN   WBC, Wet Prep HPF POC >=10 (A) <10   Sperm NONE SEEN   CBC     Status: None   Collection Time: 03/28/23  7:37 PM  Result Value Ref Range   WBC 8.2 4.0 - 10.5 K/uL   RBC 4.57 3.87 - 5.11 MIL/uL   Hemoglobin  13.4 12.0 - 15.0 g/dL   HCT 47.8 29.5 - 62.1 %   MCV 87.1 80.0 - 100.0 fL   MCH 29.3 26.0 - 34.0 pg  MCHC 33.7 30.0 - 36.0 g/dL   RDW 13.0 86.5 - 78.4 %   Platelets 257 150 - 400 K/uL   nRBC 0.0 0.0 - 0.2 %  hCG, quantitative, pregnancy     Status: Abnormal   Collection Time: 03/28/23  7:37 PM  Result Value Ref Range   hCG, Beta Chain, Quant, S 19,681 (H) <5 mIU/mL    US OB LESS THAN 14 WEEKS WITH OB TRANSVAGINAL  Result Date: 03/28/2023 CLINICAL DATA:  Pregnant, vaginal bleeding EXAM: OBSTETRIC <14 WK [place] AND TRANSVAGINAL OB US TECHNIQUE: Both transabdominal and transvaginal ultrasound examinations were performed for complete evaluation of the gestation as well as the maternal uterus, adnexal regions, and pelvic cul-de-sac. Transvaginal technique was performed to assess early pregnancy. COMPARISON:  None Available. FINDINGS: Intrauterine gestational sac: Single, irregular Yolk sac:  Not Visualized. Embryo:  Visualized. Cardiac Activity: Not Visualized. Heart Rate: 0 bpm CRL:  4.2 mm   6 w   1 d Subchorionic hemorrhage:  None visualized. Maternal uterus/adnexae: Multiple uterine fibroids, measuring up to 2.5 cm in the anterior uterine body. Suspected 3.2 cm vaginal wall cyst. Right ovary is within normal limits. Left ovary is not discretely visualized. No free fluid. IMPRESSION: Single intrauterine gestation without cardiac activity, as above. Findings are suspicious but not yet definitive for failed pregnancy. Recommend follow-up US in 10-14 days for definitive diagnosis. This recommendation follows SRU consensus guidelines: Diagnostic Criteria for Nonviable Pregnancy Early in the First Trimester. Malva Limes Med 2013; 696:2952-84. Electronically Signed   By: Charline Bills M.D.   On: 03/28/2023 20:13      Assessment and Plan  1. Incomplete miscarriage - Information provided on incomplete miscarriage - Per Dr. Su Hilt - [facility] will offer patient f/u options  - Advised to call to make sure appt  gets scheduled for sooner that 04/21/2023 (preexisting appt)  2. Vaginal bleeding affecting early pregnancy - Information provided on vaginal bleeding - Return to MAU: If you have heavier bleeding that soaks through more that 2 pads per hour for an hour or more If you bleed so much that you feel like you might pass out or you do pass out If you have significant abdominal pain that is not improved with Tylenol 1000 mg every 8 hours as needed for pain If you develop a fever > 100.5    3. Threatened miscarriage in early pregnancy - Information provided on threatened miscarriage   4. [redacted] weeks gestation of pregnancy   - Discharge patient - Someone from [facility] will call her tomorrow to setup a sooner appt for F/U - Patient verbalized an understanding of the plan of care and agrees.  Raelyn Mora, CNM 03/28/2023, 7:30 PM

## 2023-03-28 NOTE — Discharge Instructions (Signed)
Return to MAU: °If you have heavier bleeding that soaks through more that 2 pads per hour for an hour or more °If you bleed so much that you feel like you might pass out or you do pass out °If you have significant abdominal pain that is not improved with Tylenol 1000 mg every 8 hours as needed for pain °If you develop a fever > 100.5 °

## 2023-03-29 LAB — GC/CHLAMYDIA PROBE AMP (~~LOC~~) NOT AT ARMC
Chlamydia: NEGATIVE
Comment: NEGATIVE
Comment: NORMAL
Neisseria Gonorrhea: NEGATIVE

## 2023-04-13 ENCOUNTER — Other Ambulatory Visit: Payer: Self-pay | Admitting: Obstetrics and Gynecology

## 2023-04-13 ENCOUNTER — Encounter (HOSPITAL_BASED_OUTPATIENT_CLINIC_OR_DEPARTMENT_OTHER): Payer: Self-pay | Admitting: Obstetrics and Gynecology

## 2023-04-13 ENCOUNTER — Other Ambulatory Visit: Payer: Self-pay

## 2023-04-13 NOTE — Progress Notes (Addendum)
Spoke w/ via phone for pre-op interview---pt Lab needs dos----I stat   surgery orders pending            Lab results------EKG 08-04-2022 epic, chest xray 08-04-2022 epic, ett 11-17-2022 epic COVID test -----patient states asymptomatic no test needed Arrive at -------1045 am NPO after MN NO Solid Food.  Clear liquids from MN until---945 am Med rec completed Medications to take morning of surgery -----nifedipine Diabetic medication -----n/a Patient instructed no nail polish to be worn day of surgery Patient instructed to bring photo id and insurance card day of surgery Patient aware to have Driver (ride ) / caregiver   mother lanie schelling  for 24 hours after surgery  Patient Special Instructions -----none Pre-Op special Instructions -----none Patient verbalized understanding of instructions that were given at this phone interview. Patient denies shortness of breath, chest pain, fever, cough at this phone interview.

## 2023-04-19 ENCOUNTER — Ambulatory Visit (HOSPITAL_BASED_OUTPATIENT_CLINIC_OR_DEPARTMENT_OTHER): Payer: BC Managed Care – PPO | Admitting: Anesthesiology

## 2023-04-19 ENCOUNTER — Ambulatory Visit (HOSPITAL_BASED_OUTPATIENT_CLINIC_OR_DEPARTMENT_OTHER)
Admission: RE | Admit: 2023-04-19 | Discharge: 2023-04-19 | Disposition: A | Discharge status: Home / Self Care | Payer: BC Managed Care – PPO | Attending: Obstetrics and Gynecology | Admitting: Obstetrics and Gynecology

## 2023-04-19 ENCOUNTER — Other Ambulatory Visit: Payer: Self-pay | Admitting: Obstetrics and Gynecology

## 2023-04-19 ENCOUNTER — Encounter (HOSPITAL_BASED_OUTPATIENT_CLINIC_OR_DEPARTMENT_OTHER): Payer: Self-pay | Admitting: Obstetrics and Gynecology

## 2023-04-19 ENCOUNTER — Encounter (HOSPITAL_BASED_OUTPATIENT_CLINIC_OR_DEPARTMENT_OTHER)
Admission: RE | Disposition: A | Discharge status: Home / Self Care | Payer: Self-pay | Source: Home / Self Care | Attending: Obstetrics and Gynecology

## 2023-04-19 ENCOUNTER — Other Ambulatory Visit: Payer: Self-pay

## 2023-04-19 DIAGNOSIS — Z79899 Other long term (current) drug therapy: Secondary | ICD-10-CM | POA: Diagnosis not present

## 2023-04-19 DIAGNOSIS — Z01818 Encounter for other preprocedural examination: Secondary | ICD-10-CM

## 2023-04-19 DIAGNOSIS — O019 Hydatidiform mole, unspecified: Secondary | ICD-10-CM | POA: Insufficient documentation

## 2023-04-19 DIAGNOSIS — I10 Essential (primary) hypertension: Secondary | ICD-10-CM | POA: Diagnosis not present

## 2023-04-19 DIAGNOSIS — N898 Other specified noninflammatory disorders of vagina: Secondary | ICD-10-CM | POA: Diagnosis not present

## 2023-04-19 DIAGNOSIS — R7303 Prediabetes: Secondary | ICD-10-CM | POA: Insufficient documentation

## 2023-04-19 HISTORY — PX: DILATION AND EVACUATION: SHX1459

## 2023-04-19 HISTORY — DX: Prediabetes: R73.03

## 2023-04-19 LAB — HCG, QUANTITATIVE, PREGNANCY: hCG, Beta Chain, Quant, S: 40 m[IU]/mL — ABNORMAL HIGH (ref <5)

## 2023-04-19 LAB — TYPE AND SCREEN
ABO/RH(D): B POS
Antibody Screen: NEGATIVE

## 2023-04-19 LAB — POCT I-STAT, CHEM 8
BUN: 15 mg/dL (ref 6–20)
Calcium, Ion: 1.26 mmol/L (ref 1.15–1.40)
Chloride: 106 mmol/L (ref 98–111)
Creatinine, Ser: 0.9 mg/dL (ref 0.44–1.00)
Glucose, Bld: 97 mg/dL (ref 70–99)
HCT: 40 % (ref 36.0–46.0)
Hemoglobin: 13.6 g/dL (ref 12.0–15.0)
Potassium: 4.1 mmol/L (ref 3.5–5.1)
Sodium: 140 mmol/L (ref 135–145)
TCO2: 24 mmol/L (ref 22–32)

## 2023-04-19 SURGERY — DILATION AND EVACUATION, UTERUS
Anesthesia: General | Site: Uterus

## 2023-04-19 MED ORDER — TRANEXAMIC ACID-NACL 1000-0.7 MG/100ML-% IV SOLN
INTRAVENOUS | Status: AC
  Filled 2023-04-19: qty 100

## 2023-04-19 MED ORDER — TRANEXAMIC ACID-NACL 1000-0.7 MG/100ML-% IV SOLN
1000.0000 mg | Freq: Once | INTRAVENOUS | Status: AC
  Administered 2023-04-19: 1000 mg via INTRAVENOUS

## 2023-04-19 MED ORDER — LIDOCAINE HCL (PF) 2 % IJ SOLN
INTRAMUSCULAR | Status: AC
  Filled 2023-04-19: qty 10

## 2023-04-19 MED ORDER — ACETAMINOPHEN 500 MG PO TABS
ORAL_TABLET | ORAL | Status: AC
  Filled 2023-04-19: qty 2

## 2023-04-19 MED ORDER — PROPOFOL 500 MG/50ML IV EMUL
INTRAVENOUS | Status: AC
  Filled 2023-04-19: qty 50

## 2023-04-19 MED ORDER — IBUPROFEN 600 MG PO TABS: 600.0000 mg | ORAL_TABLET | Freq: Four times a day (QID) | ORAL | 0 refills | Status: AC | PRN

## 2023-04-19 MED ORDER — ONDANSETRON HCL 4 MG/2ML IJ SOLN
INTRAMUSCULAR | Status: DC | PRN
  Administered 2023-04-19: 4 mg via INTRAVENOUS

## 2023-04-19 MED ORDER — MIDAZOLAM HCL 5 MG/5ML IJ SOLN
INTRAMUSCULAR | Status: DC | PRN
  Administered 2023-04-19: 2 mg via INTRAVENOUS

## 2023-04-19 MED ORDER — LACTATED RINGERS IV SOLN: INTRAVENOUS | Status: DC

## 2023-04-19 MED ORDER — PROPOFOL 500 MG/50ML IV EMUL
INTRAVENOUS | Status: DC | PRN
  Administered 2023-04-19: 200 ug/kg/min via INTRAVENOUS
  Administered 2023-04-19: 150 ug/kg/min via INTRAVENOUS

## 2023-04-19 MED ORDER — ONDANSETRON HCL 4 MG/2ML IJ SOLN
INTRAMUSCULAR | Status: AC
  Filled 2023-04-19: qty 2

## 2023-04-19 MED ORDER — PROPOFOL 10 MG/ML IV BOLUS
INTRAVENOUS | Status: DC | PRN
  Administered 2023-04-19: 20 mg via INTRAVENOUS
  Administered 2023-04-19: 150 mg via INTRAVENOUS

## 2023-04-19 MED ORDER — ACETAMINOPHEN 500 MG PO TABS
1000.0000 mg | ORAL_TABLET | Freq: Once | ORAL | Status: AC
  Administered 2023-04-19: 1000 mg via ORAL

## 2023-04-19 MED ORDER — POVIDONE-IODINE 10 % EX SWAB: 2.0000 | Freq: Once | CUTANEOUS | Status: DC

## 2023-04-19 MED ORDER — 0.9 % SODIUM CHLORIDE (POUR BTL) OPTIME
TOPICAL | Status: DC | PRN
  Administered 2023-04-19: 500 mL

## 2023-04-19 MED ORDER — KETOROLAC TROMETHAMINE 30 MG/ML IJ SOLN
INTRAMUSCULAR | Status: AC
  Filled 2023-04-19: qty 1

## 2023-04-19 MED ORDER — DEXAMETHASONE SODIUM PHOSPHATE 10 MG/ML IJ SOLN
INTRAMUSCULAR | Status: DC | PRN
  Administered 2023-04-19: 10 mg via INTRAVENOUS

## 2023-04-19 MED ORDER — FENTANYL CITRATE (PF) 100 MCG/2ML IJ SOLN
INTRAMUSCULAR | Status: AC
  Filled 2023-04-19: qty 2

## 2023-04-19 MED ORDER — DEXAMETHASONE SODIUM PHOSPHATE 10 MG/ML IJ SOLN
INTRAMUSCULAR | Status: AC
  Filled 2023-04-19: qty 1

## 2023-04-19 MED ORDER — FENTANYL CITRATE (PF) 100 MCG/2ML IJ SOLN
INTRAMUSCULAR | Status: DC | PRN
  Administered 2023-04-19: 50 ug via INTRAVENOUS

## 2023-04-19 MED ORDER — MIDAZOLAM HCL 2 MG/2ML IJ SOLN
INTRAMUSCULAR | Status: AC
  Filled 2023-04-19: qty 2

## 2023-04-19 MED ORDER — LIDOCAINE 2% (20 MG/ML) 5 ML SYRINGE
INTRAMUSCULAR | Status: DC | PRN
  Administered 2023-04-19: 60 mg via INTRAVENOUS

## 2023-04-19 MED ORDER — KETOROLAC TROMETHAMINE 30 MG/ML IJ SOLN
INTRAMUSCULAR | Status: DC | PRN
  Administered 2023-04-19: 30 mg via INTRAVENOUS

## 2023-04-19 MED ORDER — FENTANYL CITRATE (PF) 100 MCG/2ML IJ SOLN: 25.0000 ug | INTRAMUSCULAR | Status: DC | PRN

## 2023-04-19 MED ORDER — LIDOCAINE HCL 2 % IJ SOLN
INTRAMUSCULAR | Status: DC | PRN
  Administered 2023-04-19: 10 mL

## 2023-04-19 MED ORDER — PROPOFOL 10 MG/ML IV BOLUS
INTRAVENOUS | Status: AC
  Filled 2023-04-19: qty 20

## 2023-04-19 SURGICAL SUPPLY — 20 items
CATH ROBINSON RED A/P 16FR (CATHETERS) ×1 IMPLANT
CLOTH BEACON ORANGE TIMEOUT ST (SAFETY) ×1 IMPLANT
DILATOR CANAL MILEX (MISCELLANEOUS) IMPLANT
GLOVE BIO SURGEON STRL SZ 6.5 (GLOVE) ×1 IMPLANT
GLOVE BIOGEL PI IND STRL 7.0 (GLOVE) ×2 IMPLANT
GOWN STRL REUS W/TWL LRG LVL3 (GOWN DISPOSABLE) ×2 IMPLANT
IV CATH 18G SAFETY (IV SOLUTION) IMPLANT
KIT BERKELEY 1ST TRIMESTER 3/8 (MISCELLANEOUS) ×1 IMPLANT
NS IRRIG 1000ML POUR BTL (IV SOLUTION) ×1 IMPLANT
PACK VAGINAL MINOR WOMEN LF (CUSTOM PROCEDURE TRAY) ×1 IMPLANT
PAD OB MATERNITY 4.3X12.25 (PERSONAL CARE ITEMS) ×1 IMPLANT
PAD PREP 24X48 CUFFED NSTRL (MISCELLANEOUS) ×1 IMPLANT
SET BERKELEY SUCTION TUBING (SUCTIONS) ×1 IMPLANT
SPIKE FLUID TRANSFER (MISCELLANEOUS) ×1 IMPLANT
SYR 20ML LL LF (SYRINGE) ×1 IMPLANT
TOWEL OR 17X24 6PK STRL BLUE (TOWEL DISPOSABLE) ×2 IMPLANT
VACURETTE 10 RIGID CVD (CANNULA) IMPLANT
VACURETTE 7MM CVD STRL WRAP (CANNULA) IMPLANT
VACURETTE 8 RIGID CVD (CANNULA) IMPLANT
VACURETTE 9 RIGID CVD (CANNULA) IMPLANT

## 2023-04-19 NOTE — Anesthesia Procedure Notes (Signed)
Procedure Name: LMA Insertion Date/Time: 04/19/2023 12:50 PM  Performed by: Bishop Limbo, CRNAPre-anesthesia Checklist: Patient identified, Emergency Drugs available, Suction available and Patient being monitored Patient Re-evaluated:Patient Re-evaluated prior to induction Oxygen Delivery Method: Circle System Utilized Preoxygenation: Pre-oxygenation with 100% oxygen Induction Type: IV induction Ventilation: Mask ventilation without difficulty LMA: LMA inserted LMA Size: 4.0 Number of attempts: 1 Airway Equipment and Method: Bite block Placement Confirmation: positive ETCO2 Tube secured with: Tape Dental Injury: Teeth and Oropharynx as per pre-operative assessment

## 2023-04-19 NOTE — Anesthesia Preprocedure Evaluation (Addendum)
Anesthesia Evaluation  Patient identified by MRN, date of birth, ID band Patient awake    Reviewed: Allergy & Precautions, H&P , NPO status , Patient's Chart, lab work & pertinent test results  Airway Mallampati: II  TM Distance: >3 FB Neck ROM: Full    Dental no notable dental hx. (+) Teeth Intact, Dental Advisory Given   Pulmonary neg pulmonary ROS   Pulmonary exam normal breath sounds clear to auscultation       Cardiovascular hypertension, Pt. on medications  Rhythm:Regular Rate:Normal     Neuro/Psych negative neurological ROS  negative psych ROS   GI/Hepatic negative GI ROS, Neg liver ROS,,,  Endo/Other  negative endocrine ROS    Renal/GU negative Renal ROS  negative genitourinary   Musculoskeletal   Abdominal   Peds  Hematology  (+) Blood dyscrasia, anemia   Anesthesia Other Findings   Reproductive/Obstetrics negative OB ROS                             Anesthesia Physical Anesthesia Plan  ASA: 2  Anesthesia Plan: General   Post-op Pain Management: Tylenol PO (pre-op)* and Toradol IV (intra-op)*   Induction: Intravenous  PONV Risk Score and Plan: 4 or greater and Ondansetron, Dexamethasone, TIVA, Propofol infusion and Midazolam  Airway Management Planned: LMA  Additional Equipment:   Intra-op Plan:   Post-operative Plan: Extubation in OR  Informed Consent: I have reviewed the patients History and Physical, chart, labs and discussed the procedure including the risks, benefits and alternatives for the proposed anesthesia with the patient or authorized representative who has indicated his/her understanding and acceptance.     Dental advisory given  Plan Discussed with: CRNA  Anesthesia Plan Comments:        Anesthesia Quick Evaluation

## 2023-04-19 NOTE — H&P (Signed)
Christina Valdez is an 42 y.o. female. G2P0010. Complete miscarriage on 7/10 and pathoology demonstrated molar pregnancy. Counseled on recommendation for suction D&C and procedure discussed at length. Discussed R/B/A. Risk included, but not limited to infection, bleeding, damage to surrounding structures, uterine perforation. All questions answered and informed verbal consent obtained.  Pertinent Gynecological History: Contraception: none DES exposure: unknown Blood transfusions: none Sexually transmitted diseases: no past history Previous GYN Procedures: DNC  Last mammogram: normal Date: 2022 Last pap: normal Date: 2023 OB History: G2, P0010   Menstrual History: [name] Patient's last menstrual period was 01/25/2023.    Past Medical History:  Diagnosis Date   Fibroid    Ganglion cyst of wrist, left    History of cervical incompetence    History of COVID-19 09/2020   per pt mild symptoms that resolved   Hypertension    IDA (iron deficiency anemia)    Infertility, female    Pre-diabetes     Past Surgical History:  Procedure Laterality Date   COLONOSCOPY  08/2021   DILATION AND EVACUATION N/A 02/18/2021   Procedure: DILATATION AND EVACUATION;  Surgeon: Christina Phenix, MD;  Location: [facility];  Service: Gynecology;  Laterality: N/A;   EXCISIONAL HEMORRHOIDECTOMY  09/12/2019   in [place]   GANGLION CYST EXCISION Left 11/27/2021   Procedure: left dorsal carpal ganglion cyst excision;  Surgeon: Christina Cleverly, MD;  Location: [facility];  Service: Orthopedics;  Laterality: Left;  with local   HYSTEROSCOPY WITH RESECTOSCOPE  2021   myomectomy    Family History  Problem Relation Age of Onset   Hypertension Mother    Healthy Father    Diabetes Paternal Grandmother     Social History:  reports that she has never smoked. She has never used smokeless tobacco. She reports that she does not currently use alcohol. She reports that she does not use  drugs.  Allergies: No Known Allergies  Medications Prior to Admission  Medication Sig Dispense Refill Last Dose   NIFEdipine (PROCARDIA-XL/NIFEDICAL-XL) 30 MG 24 hr tablet 1 tablet by mount daily (Patient taking differently: 30 mg daily.) 30 tablet 2 04/19/2023 at 0830   ferrous sulfate 220 (44 Fe) MG/5ML solution Take 10 mLs by mouth daily.   04/14/2023   Prenatal Vit-Fe Fumarate-FA (MULTIVITAMIN-PRENATAL) 27-0.8 MG TABS tablet Take 1 tablet by mouth daily at 12 noon.   04/14/2023    Review of Systems  All other systems reviewed and are negative.   Blood pressure 137/87, pulse 64, temperature 98.1 F (36.7 C), temperature source Oral, resp. rate 16, height 5\' 8"  (1.727 m), weight 83.8 kg, last menstrual period 01/25/2023, SpO2 97%, unknown if currently breastfeeding. Physical Exam Constitutional:      Appearance: Normal appearance.  Cardiovascular:     Rate and Rhythm: Normal rate.  Pulmonary:     Effort: Pulmonary effort is normal.  Abdominal:     General: There is no distension.     Palpations: Abdomen is soft.     Tenderness: There is no abdominal tenderness.  Skin:    General: Skin is warm and dry.  Neurological:     General: No focal deficit present.     Mental Status: She is alert and oriented to person, place, and time.     Results for orders placed or performed during the hospital encounter of 04/19/23 (from the past 24 hour(s))  I-STAT, chem 8     Status: None   Collection Time: 04/19/23 11:24 AM  Result Value Ref Range   Sodium 140 135 - 145 mmol/L   Potassium 4.1 3.5 - 5.1 mmol/L   Chloride 106 98 - 111 mmol/L   BUN 15 6 - 20 mg/dL   Creatinine, Ser 1.61 0.44 - 1.00 mg/dL   Glucose, Bld 97 70 - 99 mg/dL   Calcium, Ion 0.96 0.45 - 1.40 mmol/L   TCO2 24 22 - 32 mmol/L   Hemoglobin 13.6 12.0 - 15.0 g/dL   HCT 40.9 81.1 - 91.4 %    No results found.  Assessment/Plan: Molar pregnancy  Plan to proceed with dilation and evacuation of retained products of  conception.  Risk included, but not limited to infection, bleeding, damage to surrounding structures, uterine perforation. All questions answered and informed written and verbal consent obtained.   Christina Valdez 04/19/2023, 11:52 AM

## 2023-04-19 NOTE — Transfer of Care (Signed)
Immediate Anesthesia Transfer of Care Note  Patient: Christina Valdez  Procedure(s) Performed: DILATATION AND EVACUATION WITH SUCTION (Uterus)  Patient Location: PACU  Anesthesia Type:General  Level of Consciousness: awake, alert , oriented, and patient cooperative  Airway & Oxygen Therapy: Patient Spontanous Breathing  Post-op Assessment: Report given to RN and Post -op Vital signs reviewed and stable  Post vital signs: Reviewed and stable  Last Vitals:  Vitals Value Taken Time  BP 122/95 04/19/23 1327  Temp 36.4 C 04/19/23 1327  Pulse 69 04/19/23 1327  Resp 22 04/19/23 1327  SpO2 98 % 04/19/23 1327  Vitals shown include unfiled device data.  Last Pain:  Vitals:   04/19/23 1104  TempSrc: Oral  PainSc: 0-No pain      Patients Stated Pain Goal: 5 (04/19/23 1104)  Complications: No notable events documented.

## 2023-04-19 NOTE — Anesthesia Postprocedure Evaluation (Signed)
Anesthesia Post Note  Patient: Christina Valdez  Procedure(s) Performed: DILATATION AND EVACUATION WITH SUCTION AND ASPIRATION  OF VAGINAL CYST (Uterus)     Patient location during evaluation: PACU Anesthesia Type: General Level of consciousness: awake and alert Pain management: pain level controlled Vital Signs Assessment: post-procedure vital signs reviewed and stable Respiratory status: spontaneous breathing, nonlabored ventilation and respiratory function stable Cardiovascular status: blood pressure returned to baseline and stable Postop Assessment: no apparent nausea or vomiting Anesthetic complications: no  No notable events documented.  Last Vitals:  Vitals:   04/19/23 1345 04/19/23 1400  BP: (!) 120/95 130/89  Pulse: (!) 58 (!) 54  Resp: 19 17  Temp:    SpO2: 99% 100%    Last Pain:  Vitals:   04/19/23 1400  TempSrc:   PainSc: 0-No pain                 Stavros Cail,W. EDMOND

## 2023-04-19 NOTE — Discharge Instructions (Signed)
  Post Anesthesia Home Care Instructions  Activity: Get plenty of rest for the remainder of the day. A responsible individual must stay with you for 24 hours following the procedure.  For the next 24 hours, DO NOT: -Drive a car -Advertising copywriter -Drink alcoholic beverages -Take any medication unless instructed by your physician -Make any legal decisions or sign important papers.  Meals: Start with liquid foods such as gelatin or soup. Progress to regular foods as tolerated. Avoid greasy, spicy, heavy foods. If nausea and/or vomiting occur, drink only clear liquids until the nausea and/or vomiting subsides. Call your physician if vomiting continues.  Special Instructions/Symptoms: Your throat may feel dry or sore from the anesthesia or the breathing tube placed in your throat during surgery. If this causes discomfort, gargle with warm salt water. The discomfort should disappear within 24 hours.  No acetaminophen/Tylenol until after 5:30 pm today if needed. No ibuprofen, Advil, Aleve, Motrin, ketorolac, meloxicam, naproxen, or other NSAIDS until after 7 pm today if needed.

## 2023-04-19 NOTE — Anesthesia Procedure Notes (Deleted)
Date/Time: 04/19/2023 12:57 PM  Performed by: Bishop Limbo, CRNA

## 2023-04-19 NOTE — Op Note (Signed)
04/19/2023  1:30 PM  PATIENT:  Christina Valdez  42 y.o. female  PRE-OPERATIVE DIAGNOSIS:  Gestational trophoblastic disease, Vaginal wall cyst  POST-OPERATIVE DIAGNOSIS:  Gestational trophoblastic disease, Vaginal wall cyst  PROCEDURE:  Procedure(s): DILATATION AND EVACUATION WITH SUCTION (N/A)  SURGEON:  Surgeons and Role:    * [name], MD - Primary  PHYSICIAN ASSISTANT:   ASSISTANTS: none   ANESTHESIA:   IV sedation  EBL:  20 mL   BLOOD ADMINISTERED:none  DRAINS: none   LOCAL MEDICATIONS USED:  LIDOCAINE   SPECIMEN:  Aspirate and retain products of conception  DISPOSITION OF SPECIMEN:  PATHOLOGY  COUNTS:  YES  TOURNIQUET:  * No tourniquets in log *  DICTATION:  The patient was taken to the operating room and prepped and draped in a normal sterile fashion. An in out catheter was used to drain the bladder.   A weighted speculum was placed into the vagina and anterior lip of the cervix was grasped with a single-tooth tenaculum.  10 cc of 2% lidocaine was used for cervical block. The uterus sounded to 8 cm. The cervix was then dilated with Shawnie Pons dilators up to 23 to accommodate size 8 suction curette. Suction curettage was performed until minimal tissue returned. Gentle sharp curettage was performed. Suction curettage was performed once again to remove any remaining debris. TXA administered intra-op. The tenaculum was removed from the cervix and hemostasis was noted.  All instruments were removed. The count was correct. The patient was transferred to the recovery room in good condition.    PLAN OF CARE: Discharge to home after PACU  PATIENT DISPOSITION:  PACU - hemodynamically stable.    Dr. Reesa Chew

## 2023-04-20 ENCOUNTER — Encounter (HOSPITAL_BASED_OUTPATIENT_CLINIC_OR_DEPARTMENT_OTHER): Payer: Self-pay | Admitting: Obstetrics and Gynecology

## 2023-04-21 LAB — SURGICAL PATHOLOGY

## 2023-04-21 LAB — CYTOLOGY - NON PAP

## 2023-04-25 ENCOUNTER — Encounter: Payer: Self-pay | Admitting: Obstetrics and Gynecology

## 2023-06-02 ENCOUNTER — Encounter: Payer: Self-pay | Admitting: Obstetrics and Gynecology

## 2023-06-10 IMAGING — CT CT ANGIO CHEST
2 of 6 series · 18 of 46 positions shown · IV contrast (agent unspecified)
Comparison: None Available.

CLINICAL DATA: Pulmonary embolism suspected, high probability.
Tachycardia, shortness of breath with exertion.

EXAM:
CT ANGIOGRAPHY CHEST WITH CONTRAST
TECHNIQUE: Multidetector CT imaging of the chest was performed using the
standard protocol during bolus administration of intravenous
contrast. Multiplanar CT image reconstructions and MIPs were
obtained to evaluate the vascular anatomy.

[Series 6: thins · axial · 0.73mm/px · z∈[+844,+1074]mm · 15 of 255 slices shown]
[im 12/255  lung]
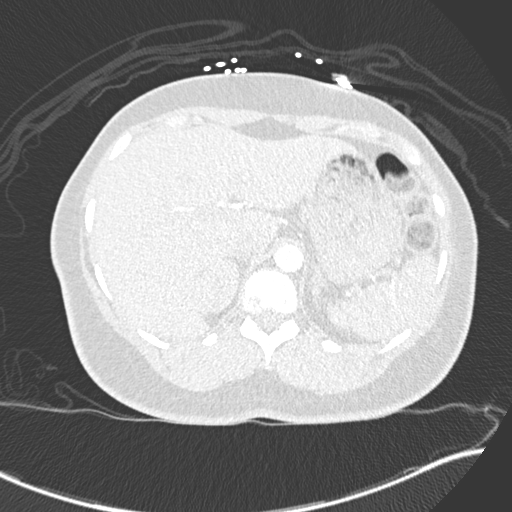
[im 34/255  soft-tissue]
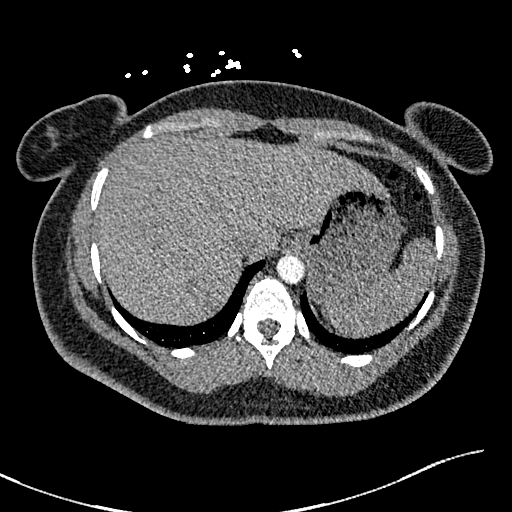
[im 45/255  lung]
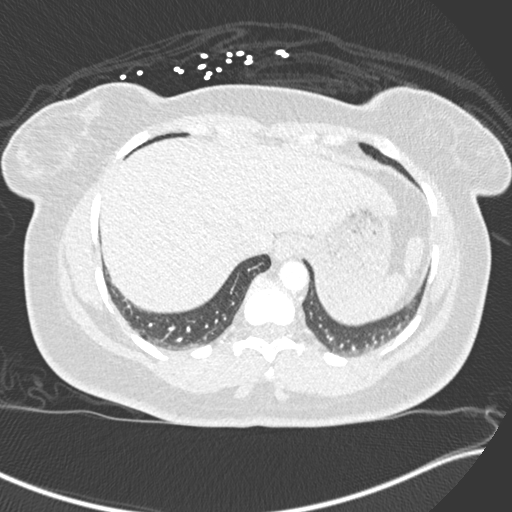
[im 67/255  soft-tissue]
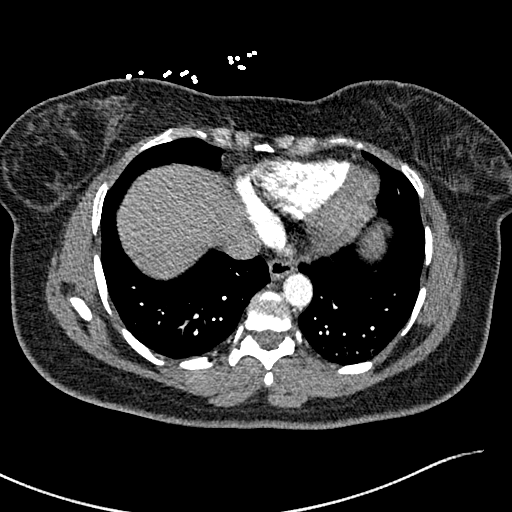
[im 78/255  lung]
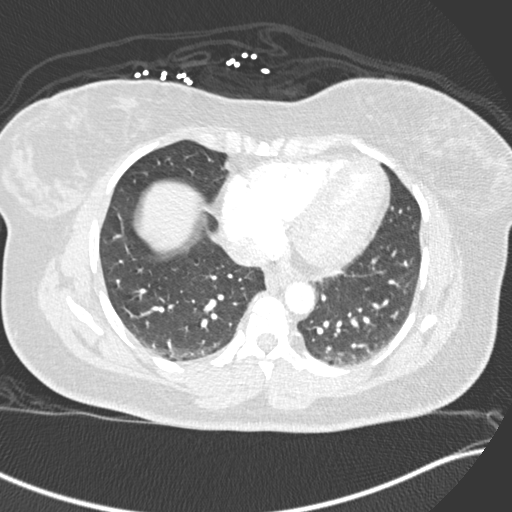
[im 100/255  soft-tissue]
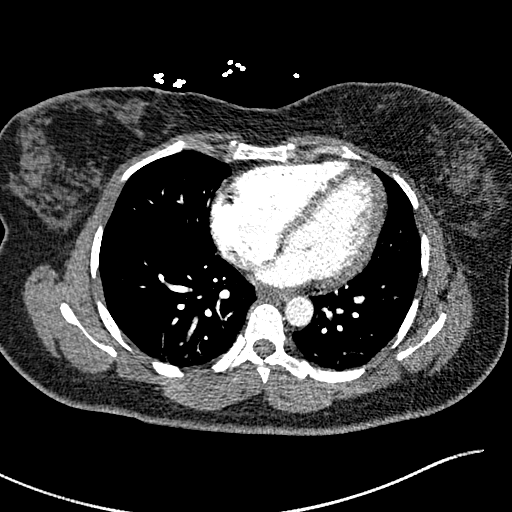
[im 111/255  lung]
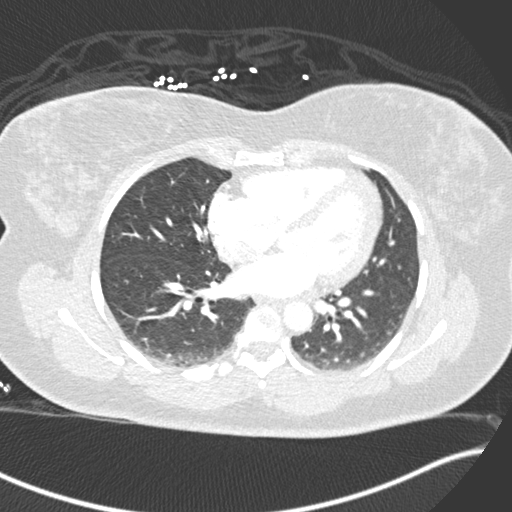
[im 133/255  soft-tissue]
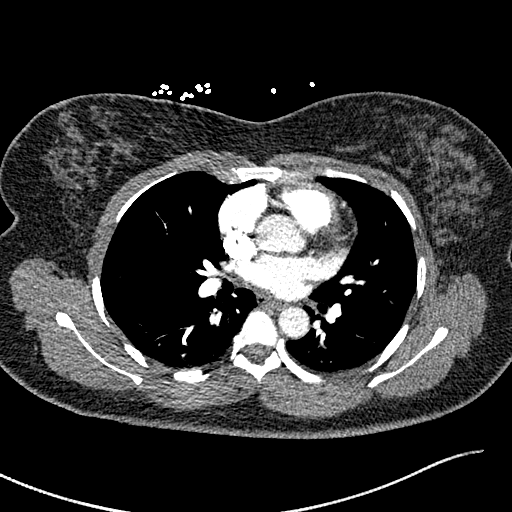
[im 144/255  lung]
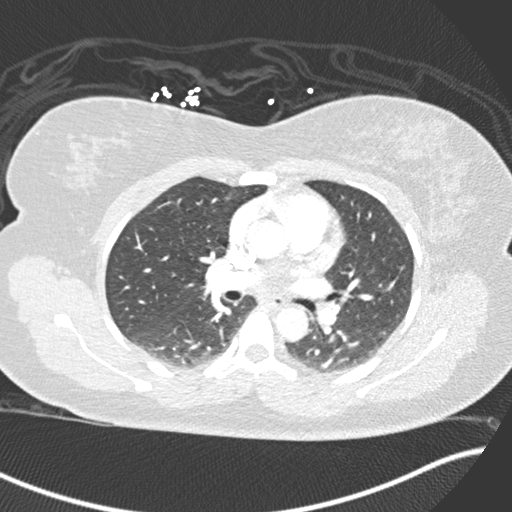
[im 155/255  soft-tissue]
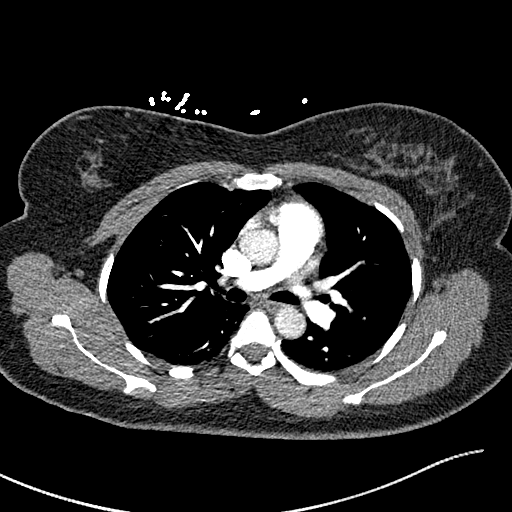
[im 177/255  lung]
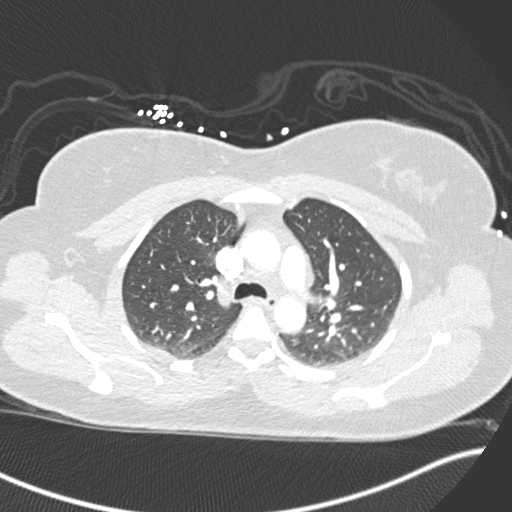
[im 188/255  soft-tissue]
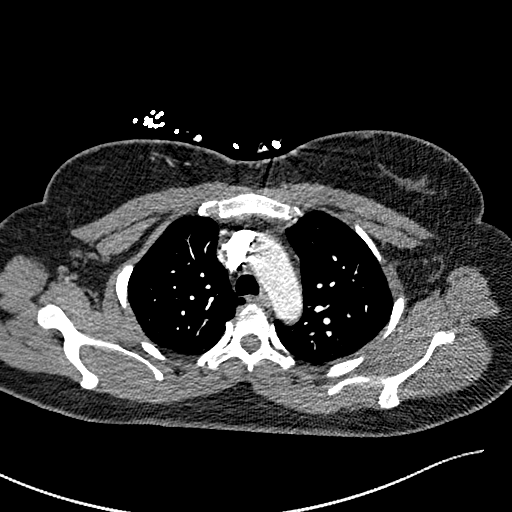
[im 210/255  lung]
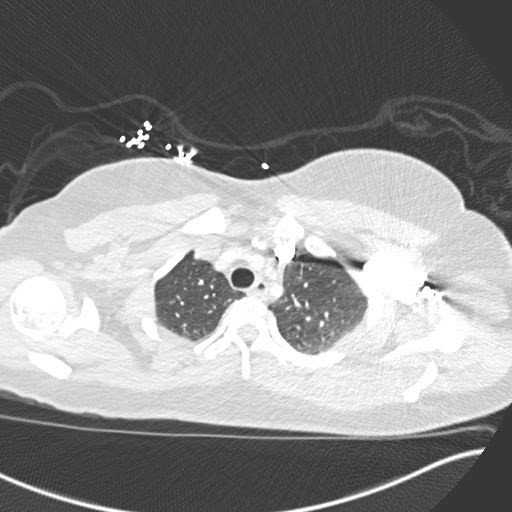
[im 221/255  soft-tissue]
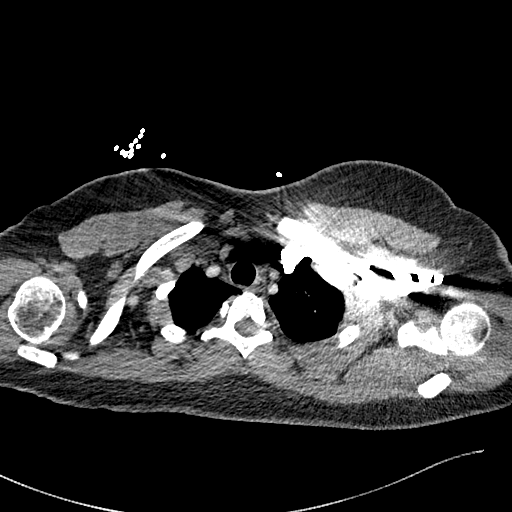
[im 243/255  lung]
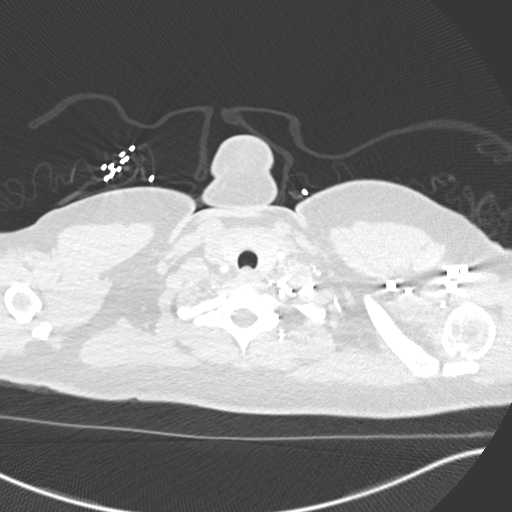

[Series 8: coronal mpr · coronal · 0.51mm/px · 3 of 137 slices shown]
[im 35/137  soft-tissue]
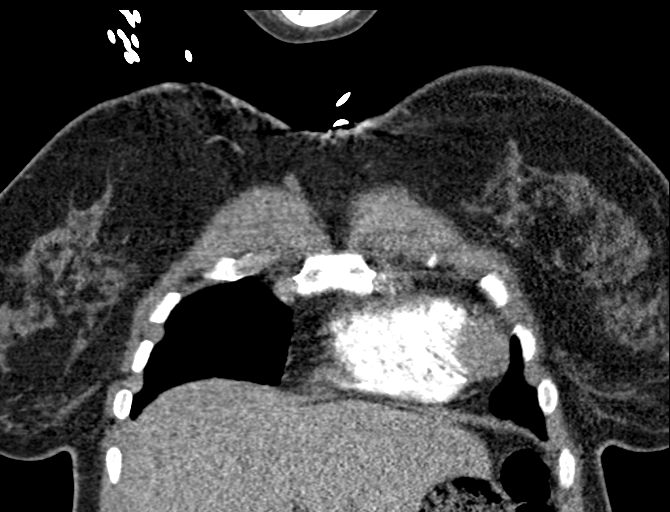
[im 69/137  soft-tissue]
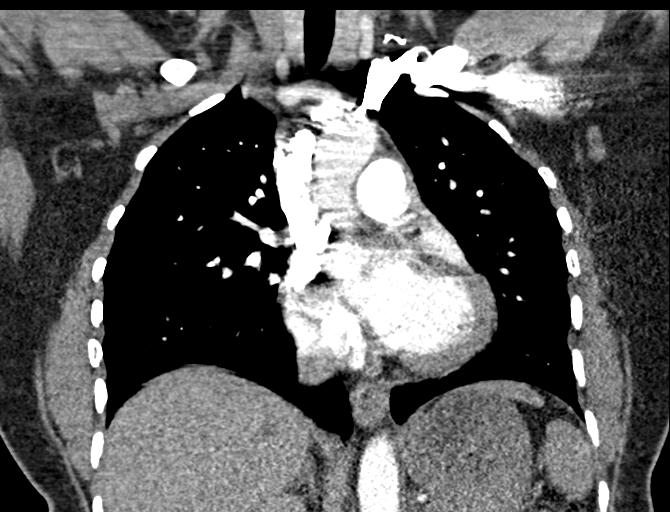
[im 103/137  soft-tissue]
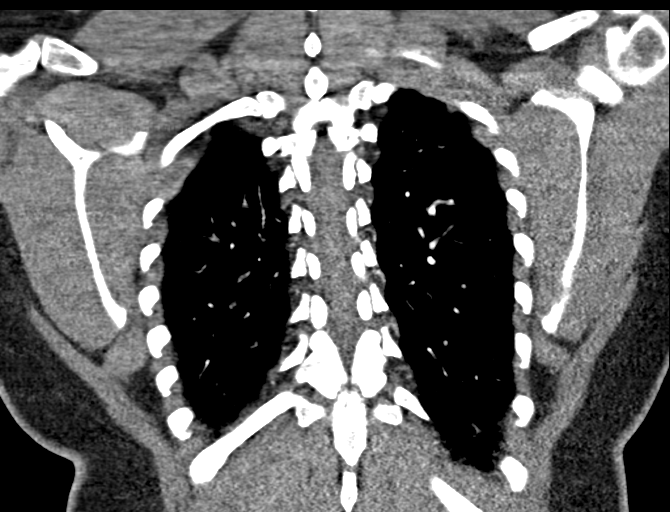

[18 of 46 positions shown; findings below may reference images not displayed]

RADIATION DOSE REDUCTION: This exam was performed according to the
departmental dose-optimization program which includes automated
exposure control, adjustment of the mA and/or kV according to
patient size and/or use of iterative reconstruction technique.

CONTRAST:  80mL OMNIPAQUE IOHEXOL 350 MG/ML SOLN
FINDINGS: Cardiovascular: Heart is normal in size and there is no pericardial
effusion. The aorta and pulmonary trunk are normal in caliber. No
pulmonary artery filling defect is identified.

Mediastinum/Nodes: No enlarged mediastinal, hilar, or axillary lymph
nodes. Thyroid gland, trachea, and esophagus demonstrate no
significant findings.

Lungs/Pleura: Dependent atelectasis is noted bilaterally. No
consolidation, effusion, or pneumothorax.

Upper Abdomen: No acute abnormality.

Musculoskeletal: No acute osseous abnormality.

Review of the MIP images confirms the above findings.
IMPRESSION: No evidence of pulmonary embolism or other acute process.

## 2023-07-31 ENCOUNTER — Emergency Department (HOSPITAL_BASED_OUTPATIENT_CLINIC_OR_DEPARTMENT_OTHER)
Admission: EM | Admit: 2023-07-31 | Discharge: 2023-07-31 | Disposition: A | Discharge status: Home / Self Care | Payer: Non-veteran care | Attending: Emergency Medicine | Admitting: Emergency Medicine

## 2023-07-31 ENCOUNTER — Encounter (HOSPITAL_BASED_OUTPATIENT_CLINIC_OR_DEPARTMENT_OTHER): Payer: Self-pay

## 2023-07-31 ENCOUNTER — Emergency Department (HOSPITAL_BASED_OUTPATIENT_CLINIC_OR_DEPARTMENT_OTHER): Payer: Non-veteran care

## 2023-07-31 DIAGNOSIS — Z79899 Other long term (current) drug therapy: Secondary | ICD-10-CM | POA: Diagnosis not present

## 2023-07-31 DIAGNOSIS — Z8616 Personal history of COVID-19: Secondary | ICD-10-CM | POA: Insufficient documentation

## 2023-07-31 DIAGNOSIS — I10 Essential (primary) hypertension: Secondary | ICD-10-CM | POA: Diagnosis not present

## 2023-07-31 DIAGNOSIS — R519 Headache, unspecified: Secondary | ICD-10-CM | POA: Diagnosis present

## 2023-07-31 LAB — BASIC METABOLIC PANEL
Anion gap: 8 (ref 5–15)
BUN: 14 mg/dL (ref 6–20)
CO2: 27 mmol/L (ref 22–32)
Calcium: 9.6 mg/dL (ref 8.9–10.3)
Chloride: 103 mmol/L (ref 98–111)
Creatinine, Ser: 1 mg/dL (ref 0.44–1.00)
GFR, Estimated: >60 mL/min (ref >60)
Glucose, Bld: 126 mg/dL — ABNORMAL HIGH (ref 70–99)
Potassium: 3.4 mmol/L — ABNORMAL LOW (ref 3.5–5.1)
Sodium: 138 mmol/L (ref 135–145)

## 2023-07-31 LAB — HCG, SERUM, QUALITATIVE: Preg, Serum: NEGATIVE

## 2023-07-31 MED ORDER — PROCHLORPERAZINE EDISYLATE 10 MG/2ML IJ SOLN
5.0000 mg | Freq: Once | INTRAMUSCULAR | Status: AC
  Administered 2023-07-31: 5 mg via INTRAVENOUS
  Filled 2023-07-31: qty 2

## 2023-07-31 MED ORDER — KETOROLAC TROMETHAMINE 15 MG/ML IJ SOLN
15.0000 mg | Freq: Once | INTRAMUSCULAR | Status: AC
  Administered 2023-07-31: 15 mg via INTRAVENOUS
  Filled 2023-07-31: qty 1

## 2023-07-31 NOTE — Discharge Instructions (Signed)
Your blood pressure is elevated.  Follow-up with your doctor and monitor it at home.  Your blood work and CAT scan were reassuring today.

## 2023-07-31 NOTE — ED Triage Notes (Signed)
Woke up at 930 this am with headache and high BP

## 2023-07-31 NOTE — ED Provider Notes (Signed)
[place] [facility] DEPARTMENT AT [facility] Provider Note   CSN: [id number] Arrival date & time: 07/31/23  1948     History  Chief Complaint  Patient presents with   Hypertension    Christina Valdez is a 42 y.o. female.   Hypertension  Patient with headache.  Woke up this morning with it.  Is on the right side of her head.  Dull.  Does not tend to get headaches.  Has been somewhat constant today.  Took a baby aspirin with some mild relief.  States she is on amlodipine.  Has had issues with her blood pressure.  No numbness or weakness.  No confusion.  No chest pain or trouble breathing.  No vision changes.    Past Medical History:  Diagnosis Date   Fibroid    Ganglion cyst of wrist, left    History of cervical incompetence    History of COVID-19 09/2020   per pt mild symptoms that resolved   Hypertension    IDA (iron deficiency anemia)    Infertility, female    Pre-diabetes     Home Medications Prior to Admission medications   Medication Sig Start Date End Date Taking? Authorizing Provider  labetalol (NORMODYNE) 100 MG tablet Take 100 mg by mouth 2 (two) times daily.   Yes [provider]  ferrous sulfate 220 (44 Fe) MG/5ML solution Take 10 mLs by mouth daily. 03/03/20   [provider]  NIFEdipine (PROCARDIA-XL/NIFEDICAL-XL) 30 MG 24 hr tablet 1 tablet by mount daily Patient taking differently: 30 mg daily. 02/26/21   Hermina Staggers, MD      Allergies    Patient has no known allergies.    Review of Systems   Review of Systems  Physical Exam Updated Vital Signs BP (!) 149/98 (BP Location: Right Arm)   Pulse 72   Temp 98.3 F (36.8 C) (Oral)   Resp 17   Ht 5\' 8"  (1.727 m)   Wt 86.2 kg   LMP 01/25/2023   SpO2 100%   BMI 28.89 kg/m  Physical Exam Constitutional:      Appearance: Normal appearance.  HENT:     Head: Atraumatic.     Right Ear: Tympanic membrane normal.  Eyes:     Pupils: Pupils are equal, round, and reactive  to light.  Cardiovascular:     Rate and Rhythm: Regular rhythm.  Pulmonary:     Breath sounds: No wheezing or rhonchi.  Neurological:     Mental Status: She is oriented to person, place, and time. Mental status is at baseline.     ED Results / Procedures / Treatments   Labs (all labs ordered are listed, but only abnormal results are displayed) Labs Reviewed  BASIC METABOLIC PANEL - Abnormal; Notable for the following components:      Result Value   Potassium 3.4 (*)    Glucose, Bld 126 (*)    All other components within normal limits  HCG, SERUM, QUALITATIVE    EKG None  Radiology CT Head Wo Contrast  Result Date: 07/31/2023 CLINICAL DATA:  Headache EXAM: CT HEAD WITHOUT CONTRAST TECHNIQUE: Contiguous axial images were obtained from the base of the skull through the vertex without intravenous contrast. RADIATION DOSE REDUCTION: This exam was performed according to the departmental dose-optimization program which includes automated exposure control, adjustment of the mA and/or kV according to patient size and/or use of iterative reconstruction technique. COMPARISON:  None Available. FINDINGS: Brain: No evidence of acute infarction, hemorrhage,  hydrocephalus, extra-axial collection or mass lesion/mass effect. Vascular: No hyperdense vessel or unexpected calcification. Skull: Normal. Negative for fracture or focal lesion. Sinuses/Orbits: No acute finding. Other: None. IMPRESSION: No acute intracranial findings. Electronically Signed   By: Duanne Guess D.O.   On: 07/31/2023 20:51    Procedures Procedures    Medications Ordered in ED Medications  ketorolac (TORADOL) 15 MG/ML injection 15 mg (15 mg Intravenous Given 07/31/23 2036)  prochlorperazine (COMPAZINE) injection 5 mg (5 mg Intravenous Given 07/31/23 2036)    ED Course/ Medical Decision Making/ A&P                                 Medical Decision Making Amount and/or Complexity of Data Reviewed Labs:  ordered. Radiology: ordered.  Risk Prescription drug management.   Patient with headache.  Nonfocal exam.  Also hypertension.  Unsure if the hypertension is causing the headache or potentially the other way around.  Since headaches are unusual for her will get CT scan to further evaluate.  Also get basic blood work and treat with headache cocktail.  [facility]  Head CT reviewed and reassuring.  Blood pressure improving some and feels better after treatment.  At this point do not think we need to adjust blood pressure medicines.  Headache could be causing the hypertension.  Short-term follow-up PCP however.  Will discharge home.        Final Clinical Impression(s) / ED Diagnoses Final diagnoses:  Nonintractable headache, unspecified chronicity pattern, unspecified headache type  Hypertension, unspecified type    Rx / DC Orders ED Discharge Orders     None         Benjiman Core, MD 07/31/23 2142

## 2023-11-29 ENCOUNTER — Emergency Department (HOSPITAL_COMMUNITY)
Admission: EM | Admit: 2023-11-29 | Discharge: 2023-11-29 | Disposition: A | Discharge status: Home / Self Care | Attending: Emergency Medicine | Admitting: Emergency Medicine

## 2023-11-29 ENCOUNTER — Encounter (HOSPITAL_COMMUNITY): Payer: Self-pay

## 2023-11-29 DIAGNOSIS — M79602 Pain in left arm: Secondary | ICD-10-CM | POA: Insufficient documentation

## 2023-11-29 DIAGNOSIS — M549 Dorsalgia, unspecified: Secondary | ICD-10-CM | POA: Insufficient documentation

## 2023-11-29 DIAGNOSIS — I10 Essential (primary) hypertension: Secondary | ICD-10-CM | POA: Diagnosis not present

## 2023-11-29 MED ORDER — IBUPROFEN 800 MG PO TABS
800.0000 mg | ORAL_TABLET | Freq: Once | ORAL | Status: AC
  Administered 2023-11-29: 800 mg via ORAL
  Filled 2023-11-29: qty 1

## 2023-11-29 MED ORDER — LIDOCAINE 5 % EX PTCH
1.0000 | MEDICATED_PATCH | CUTANEOUS | Status: DC
  Administered 2023-11-29: 1 via TRANSDERMAL
  Filled 2023-11-29: qty 1

## 2023-11-29 NOTE — ED Provider Notes (Cosign Needed Addendum)
 [place] EMERGENCY DEPARTMENT AT [facility] Provider Note   CSN: [id number] Arrival date & time: 11/29/23  1136     History  Chief Complaint  Patient presents with   Back Pain   Arm Pain   HPI [name] is a 43 y.o. female with a history of hypertension presenting for back pain and left arm pain.  The back pain started about a month ago.  Denies any trauma to the back.  It is in the upper mid back and radiates outward.  Denies any radiation into the chest.  Denies shortness of breath.  Pain is relieved with movement.  It is sharp pain.  Left arm pain is under the left tricep specifically and is nonradiating otherwise.  It started last night.  No trauma associated.  Denies chest pain.  States she normally takes labetalol but forgot to do so this morning for her hypertension.  Denies OCP use, recent immobilization, swelling and tenderness in her lower extremities and no known history of coagulopathy.   Back Pain Arm Pain       Home Medications Prior to Admission medications   Medication Sig Start Date End Date Taking? Authorizing Provider  ferrous sulfate 220 (44 Fe) MG/5ML solution Take 10 mLs by mouth daily. 03/03/20   [provider]  labetalol (NORMODYNE) 100 MG tablet Take 100 mg by mouth 2 (two) times daily.    [provider]  NIFEdipine (PROCARDIA-XL/NIFEDICAL-XL) 30 MG 24 hr tablet 1 tablet by mount daily Patient taking differently: 30 mg daily. 02/26/21   Hermina Staggers, MD      Allergies    Patient has no known allergies.    Review of Systems   Review of Systems  Musculoskeletal:  Positive for back pain.    Physical Exam   Vitals:   11/29/23 1154  BP: (!) 171/97  Pulse: 87  Resp: 16  Temp: 98.1 F (36.7 C)    CONSTITUTIONAL:  well-appearing, NAD NEURO:  Alert and oriented x 3, CN 3-12 grossly intact EYES:  eyes equal and reactive ENT/NECK:  Supple, no stridor  CARDIO:  regular rate and rhythm, appears well-perfused   PULM:  No respiratory distress, CTAB GI/GU:  non-distended, soft MSK/SPINE:  No gross deformities, no edema, moves all extremities.  The back is atraumatic, range of motion is normal.  No midline tenderness of the spine.  No step-offs or crepitance in the spine as well.  No swelling or tenderness of the left arm.  No erythema or edema noted. SKIN:  no rash, atraumatic   *Additional and/or pertinent findings included in MDM below   ED Results / Procedures / Treatments   Labs (all labs ordered are listed, but only abnormal results are displayed) Labs Reviewed - No data to display  EKG None  Radiology No results found.  Procedures Procedures    Medications Ordered in ED Medications  ibuprofen (ADVIL) tablet 800 mg (has no administration in time range)    ED Course/ Medical Decision Making/ A&P                                 Medical Decision Making Risk Prescription drug management.   43 year old well-appearing female presenting for back pain for a month and left arm pain that started last night.  Exam was unremarkable.  Suspect these are MSK symptoms.  Considered PE but unlikely given no chest pain or shortness of  breath and lack of risk factors.  Also considered ACS but no radiation into the chest for pain.  Advised NSAIDs and conservative treatment at home for MSK pain.  Advised to follow-up with her PCP.  Discussed return precautions.  Discharged in good condition.  Also considered DVT of the left arm but unlikely given no swelling or erythema noted.  There was no tenderness to the posterior aspect of the left arm as well.   Also her blood pressure was noted to be elevated.  Suspect it is related to her not taking her labetalol today as prescribed.  Advised her to take her normal dose when she gets home and to follow-up with her PCP.     Final Clinical Impression(s) / ED Diagnoses Final diagnoses:  Back pain, unspecified back location, unspecified back pain  laterality, unspecified chronicity  Left arm pain    Rx / DC Orders ED Discharge Orders     None            Gareth Eagle, PA-C 11/29/23 1610    Derwood Kaplan, MD 11/30/23 1556

## 2023-11-29 NOTE — Discharge Instructions (Addendum)
 Evaluation today was overall reassuring.  Recommend that you follow-up with your PCP.  In the meantime you can take ibuprofen, recommend gentle stretching and applying ice 3-4 times a day.  If you develop chest pain, shortness of breath, trauma to your back or any other concerning symptom please return emerged part further evaluation.

## 2023-11-29 NOTE — ED Triage Notes (Signed)
 Pt presents with c/o back pain between her shoulder blades present for approx one month. Pt also c/o left arm pain that started last night.

## 2097-09-28 ENCOUNTER — Ambulatory Visit: Admission: RE | Payer: 59 | Source: Ambulatory Visit

## 2097-09-28 ENCOUNTER — Ambulatory Visit: Admission: RE | Admit: 2097-09-28 | Payer: 59 | Source: Ambulatory Visit | Admitting: OBSTETRICS/GYN

## 2097-09-28 SURGERY — MYOMECTOMY, UTERUS, HYSTEROSCOPIC
Anesthesia: General | Site: Uterus
